# Patient Record
Sex: Female | Born: 1965 | Race: White | Hispanic: No | Marital: Married | Smoking: Former smoker
Health system: Southern US, Community
[De-identification: ages and names within clinical notes are randomized; demographics above are authoritative.]

## PROBLEM LIST (undated history)

## (undated) DIAGNOSIS — I10 Essential (primary) hypertension: Secondary | ICD-10-CM

## (undated) DIAGNOSIS — C801 Malignant (primary) neoplasm, unspecified: Secondary | ICD-10-CM

## (undated) HISTORY — DX: Essential (primary) hypertension: I10

## (undated) HISTORY — DX: Malignant (primary) neoplasm, unspecified: C80.1

## (undated) HISTORY — PX: HERNIA REPAIR: SHX51

## (undated) HISTORY — PX: LEEP: SHX91

---

## 2000-07-29 ENCOUNTER — Other Ambulatory Visit: Admission: RE | Admit: 2000-07-29 | Discharge: 2000-07-29 | Payer: Self-pay | Admitting: Physician Assistant

## 2000-11-05 ENCOUNTER — Other Ambulatory Visit: Admission: RE | Admit: 2000-11-05 | Discharge: 2000-11-05 | Payer: Self-pay | Admitting: *Deleted

## 2000-11-06 ENCOUNTER — Other Ambulatory Visit: Admission: RE | Admit: 2000-11-06 | Discharge: 2000-11-06 | Payer: Self-pay | Admitting: *Deleted

## 2000-11-06 ENCOUNTER — Encounter (INDEPENDENT_AMBULATORY_CARE_PROVIDER_SITE_OTHER): Payer: Self-pay

## 2000-11-21 ENCOUNTER — Other Ambulatory Visit: Admission: RE | Admit: 2000-11-21 | Discharge: 2000-11-21 | Payer: Self-pay | Admitting: *Deleted

## 2000-12-10 ENCOUNTER — Ambulatory Visit: Admission: RE | Admit: 2000-12-10 | Discharge: 2000-12-10 | Payer: Self-pay | Admitting: Gynecology

## 2001-05-11 ENCOUNTER — Other Ambulatory Visit: Admission: RE | Admit: 2001-05-11 | Discharge: 2001-05-11 | Payer: Self-pay | Admitting: *Deleted

## 2005-04-08 ENCOUNTER — Ambulatory Visit: Payer: Self-pay | Admitting: Internal Medicine

## 2005-04-08 ENCOUNTER — Other Ambulatory Visit: Admission: RE | Admit: 2005-04-08 | Discharge: 2005-04-08 | Payer: Self-pay | Admitting: Internal Medicine

## 2007-04-07 ENCOUNTER — Ambulatory Visit: Payer: Self-pay | Admitting: Internal Medicine

## 2007-04-07 DIAGNOSIS — L258 Unspecified contact dermatitis due to other agents: Secondary | ICD-10-CM | POA: Insufficient documentation

## 2008-03-25 ENCOUNTER — Inpatient Hospital Stay (HOSPITAL_COMMUNITY): Admission: AD | Admit: 2008-03-25 | Discharge: 2008-03-25 | Payer: Self-pay | Admitting: Obstetrics and Gynecology

## 2008-03-31 ENCOUNTER — Inpatient Hospital Stay (HOSPITAL_COMMUNITY): Admission: AD | Admit: 2008-03-31 | Discharge: 2008-04-06 | Payer: Self-pay | Admitting: Obstetrics and Gynecology

## 2008-04-02 ENCOUNTER — Encounter (INDEPENDENT_AMBULATORY_CARE_PROVIDER_SITE_OTHER): Payer: Self-pay | Admitting: Obstetrics & Gynecology

## 2008-04-07 ENCOUNTER — Encounter: Admission: RE | Admit: 2008-04-07 | Discharge: 2008-05-07 | Payer: Self-pay | Admitting: Obstetrics and Gynecology

## 2008-05-08 ENCOUNTER — Encounter: Admission: RE | Admit: 2008-05-08 | Discharge: 2008-06-07 | Payer: Self-pay | Admitting: Obstetrics and Gynecology

## 2008-06-08 ENCOUNTER — Encounter: Admission: RE | Admit: 2008-06-08 | Discharge: 2008-07-07 | Payer: Self-pay | Admitting: Obstetrics and Gynecology

## 2008-07-08 ENCOUNTER — Encounter: Admission: RE | Admit: 2008-07-08 | Discharge: 2008-08-07 | Payer: Self-pay | Admitting: Obstetrics and Gynecology

## 2008-08-08 ENCOUNTER — Encounter: Admission: RE | Admit: 2008-08-08 | Discharge: 2008-08-19 | Payer: Self-pay | Admitting: Obstetrics and Gynecology

## 2011-01-29 NOTE — Discharge Summary (Signed)
NAMEHAILI, Jennifer Monroe              ACCOUNT NO.:  [id number]   MEDICAL RECORD NO.:  [id number]          PATIENT TYPE:  INP   LOCATION:  9306                          FACILITY:  [facility]   PHYSICIAN:  Dineen Kid. Rana Snare, M.D.    DATE OF BIRTH:  June 28, 1966   DATE OF ADMISSION:  03/31/2008  DATE OF DISCHARGE:  04/06/2008                               DISCHARGE SUMMARY   ADMITTING DIAGNOSES:  1. Intrauterine pregnancy at 40 weeks' estimated gestational age.  2. Twin gestation.  3. Spontaneous rupture of membranes.   DISCHARGE DIAGNOSES:  1. Status post low transverse cesarean section.  2. Viable female and female infant.   PROCEDURE:  Primary low-transverse cesarean section with a vertical  extension of the incision.   REASON FOR ADMISSION:  Please see written H and P.   HOSPITAL COURSE:  The patient was a 45 year old gravida 1, para 0 that  presents to [facility] at 37 weeks' estimated  gestational age with twin gestation.  The patient had complained of some  vaginal bleeding.  She was also noted to have some increasing  contractions.  The patient also complained of some leakage of fluid.  Sterile speculum exam did reveal positive pool, positive Nitrazine and  easily cervix was dilated at 1 cm.  On admission, WBC count was 48,000.  The patient was afebrile.  She was now admitted for antibiotic therapy,  betamethasone and tocolysis of her labor.  The patient did have maternal  fetal medicine consult and thought was to continue with the tocolysis  until the betamethasone had had an opportunity to assist lung maturity.  Late that evening, the patient was noted to have some difficulty with  breathing.  Exam was otherwise normal, O2 saturation was 94% on room  air.  Lungs were clear to auscultation.  Because of these symptoms,  decision was made to decrease her magnesium to 1 gram per hour.  Mag  level was noted to be 5.2.  Approximately 3 hours later, decision was  notified that  the patient did complain of some pressure; however, the  nurse could not determine whether or not the cervix was dilated or not.  Physician arrived shortly thereafter and was found to be twin A within  the vertex presentation of +3 station, completely dilated.  Based on  prematurity and now on the second twin within the transverse  presentation and the larger of the 2 twins, decision was made to urgent  labor and take the patient to the operating room where spinal anesthesia  was administered without difficulty.  A low transverse incision was made  with the delivery of a viable female infant; however, there was some  difficulty with the second twin and extension vertically was made of the  incision in order to delivery the second twin which was a viable female  infant.  Babies were taken by the NICU team.  Closure was performed, 2  small myomas were also noted and were removed.  Hemostasis was obtained  and the patient was then transferred to the recovery room in stable  condition.  On postoperative  day #1, the patient was stable, she was  without complaint.  Abdomen soft.  Abdominal dressing noted to be clean,  dry and intact.  On postoperative day #2, the patient was without  complaint.  Temperature was noted to be 99.6.  Vital signs were  otherwise stable.  Abdomen soft.  Fundus firm, moderate tenderness  noted.  Incision was clean, dry and intact.  The patient was thought to  possibly have some endometritis and was started on Augmentin.  On  postoperative day #3, the patient was without complaint.  Temperature  max was 101.  Fundus firm, nontender.  Incision was clean, dry and  intact.  Babies were stable in the NICU.  The patient continued on  Augmentin.  On postoperative day #4, the patient was without complaint.  Vital signs were stable.  She was afebrile.  Babies were stable in the  NICU off the ventilator.  Fundus was firm and nontender.  T-shaped  incision was noted.  Right-sided  inguinal hernia noted.  Discharge  instructions were reviewed and the patient was later discharged home.   CONDITION ON DISCHARGE:  Stable.   DIET:  Regular as tolerated.   ACTIVITY:  No heavy lifting, no driving x2 weeks, no vaginal entry.   FOLLOWUP:  The patient is to follow up in the office in 4 days for  staple removal.  She is to call for temperature greater than 100  degrees, persistent nausea, vomiting, vaginal bleeding, and/or redness  or drainage at incisional site.   DISCHARGE MEDICATIONS:  1. Tylox #30 one p.o. every 4-6 hours.  2. Motrin 600 mg every 6 hours.  3. Augmentin 500 mg 1 p.o. t.i.d.      Julio Sicks, N.P.      Dineen Kid Rana Snare, M.D.  Electronically Signed    CC/MEDQ  D:  05/01/2008  T:  05/02/2008  Job:  [id number]

## 2011-01-29 NOTE — Op Note (Signed)
NAMEEVERLINE, MAHAFFY NO.:  [id number]   MEDICAL RECORD NO.:  [id number]          PATIENT TYPE:  INP   LOCATION:  9306                          FACILITY:  [facility]   PHYSICIAN:  Freddy Finner, M.D.   DATE OF BIRTH:  Sep 28, 1965   DATE OF PROCEDURE:  04/02/2008  DATE OF DISCHARGE:                               OPERATIVE REPORT   PREOPERATIVE DIAGNOSES:  1. Intrauterine pregnancy at [redacted] weeks gestation with twins.  2. Premature rupture of membranes.  3. Preterm labor.  4. Sudden progression of labor with known transverse Twin B and vertex      Twin A.   POSTOPERATIVE DIAGNOSES:  1. Intrauterine pregnancy at [redacted] weeks gestation with twins.  2. Premature rupture of membranes.  3. Preterm labor.  4. Sudden progression of labor with known transverse Twin B and vertex      Twin A.   OPERATIVE PROCEDURE:  Primary cesarean section with transverse lower  uterine segment incision followed by vertical extension of incision as  well as extension of skin incision.   SURGEON:  Freddy Finner, M.D.   ANESTHESIA:  Spinal.   ESTIMATED INTRAOPERATIVE BLOOD LOSS:  800 mL.   Apgars are not know to me at the time of this dictation, but the infants  were viable Twin A female and viable Twin B female. NICU teams were in  attendance.   The patient is a 45 year old was admitted on July 16 with spontaneous  rupture of membranes of Twin A.  The patient was admitted to the  hospital, tocolysis had been given, IV antibiotics.  She had maternal  fetal medicine consult.  Plan was to continue tocolysis at least until  steroids had an opportunity to fully act.  Late in the evening of the  17th, I was called send the patient for increasing difficulty with  breathing.  Her exam seemed to be normal with O2 saturations of 94 on  room air, and lungs were clear to auscultation throughout, but because  of the symptoms, it was elected to get a magnesium level which was 5.2,  and we decreased her  magnesium from 2 g an hour to 1 g an hour.  Approximately 3 hours later, I got a call from the nurse saying the  patient was feeling a strong urge to have a bowel movement, and on her  examination, she could not comfortably determined whether or not the  cervix was dilated.  She could not find the cervical os.  On my exam  shortly thereafter, it was apparent that the patient was completely  dilated.  The Twin A vertex was +3.  Based on the prematurely, based on  the known position on the second twin, it was elected to proceed with  cesarean delivery.  The patient was rapidly brought to the operating  room.  She had a Foley catheter in place.  She was placed under spinal  anesthesia, placed in dorsal recumbent position with elevation of the  right hip by 15 degrees.  The abdomen was prepped and draped in the  usual fashion.  A  low abdominal transverse incision was made and carried  sharply down to fascia.  Fascia was entered sharply and extended to the  external skin incision.  Rectus muscles were divided in midline.  Peritoneum was elevated and entered sharply.  Bladder blade was placed.  Transverse incision was made in the visceral peritoneum and bladder  bluntly resected off the lower segment.  Transverse incision was made in  the lower uterine segment and extended in a transverse direction with  bandage scissors.  Repeated attempts to elevate the Twin A head out of  the pelvis were unsuccessful, even with the vaginal end from the nurse  to try to elevate the head out of the pelvis.  The patient was given  sublingual nitroglycerin.  Again, repeated attempts were unsuccessful,  and it was elected to extend the incision and perhaps deliver the Twin B  first.  The skin incision was vertically extended for a distance of  approximately 8-10 cm as was the uterine incision.  By this time,  apparently the nitroglycerin had had more of an effect, and Twin A  essentially floated up out of the  pelvis and was easily delivered.  Twin  B was then delivered after amniotomy with a breech extraction.  The  infant was transverse back up.  This was a viable female.  Placenta and  other products of conception were removed from the uterus.  Uterus was  delivered onto the anterior abdominal wall.  Edges of the uterine  incision were grasped with ring forceps.  The transverse incision was  closed using looped 0 Dexon from the right margin to the midline.  The  midline vertical incision was then closed with a running locking looped  0 Dexon.  The transverse incision was then completely closed with a  running double Dexon.  Hemostasis was noted to be adequate.  Tubes and  ovaries were found to be normal.  Two small myomas which were actually  with the incision were excised for more easy closure of the transverse  and uterine incision.  These measured 1 approximately 2 cm x 1 cm, the  other approximately 1 cm.  These were submitted separately for  histologic exam.  With adequate hemostasis, uterus was placed back into  the abdominal cavity.  Irrigation was carried out.  Fascial incisions  were then closed starting with vertically using double looped PDS to  rule the fascia from superior to inferior and running the fascia  transversely from angle to angle on either side.  The subcutaneous  tissue at the vertical portion of the incision was closed with deep  suture 2-0 plane.  Skin incision was closed with skin staples.  The  patient tolerated the operative procedure well.  She was taken to the  recovery room in good condition.      Freddy Finner, M.D.  Electronically Signed     WRN/MEDQ  D:  04/02/2008  T:  04/02/2008  Job:  [id number]

## 2011-02-01 NOTE — Consult Note (Signed)
[facility]  Patient:    Jennifer Monroe, Jennifer Monroe                       MRN: [id number] Proc. Date: 12/10/00 Adm. Date:  [id number] Attending:  Jeannette Corpus CC:         Sung Amabile. Jennifer Monroe, M.D.  Velora Heckler, M.D.  Telford Nab, R.N.   Consultation Report  HISTORY OF PRESENT ILLNESS:  [age]-year-old white female referred by Dr. Sung Amabile. Jennifer Monroe for evaluation of a microinvasive carcinoma of the cervix. The patient underwent a LEEP procedure for evaluation of severe dysplasia on March 7th.  Final pathology showed the patient had an extensive carcinoma in situ with positive endocervical margin; in addition, she had one area of focal microinvasion up to 0.5 mm.  The patient has had an uncomplicated postoperative course.  PAST MEDICAL HISTORY:  Medical illnesses:  None.  SURGICAL HISTORY:  None.  CURRENT MEDICATIONS:  Ortho Tri-Cyclen.  SOCIAL HISTORY:  The patient smokes about one-half pack per day.  REVIEW OF SYSTEMS:  Otherwise negative.  The patient is strongly desirous of preserving fertility if at all possible.  PHYSICAL EXAMINATION:  VITAL SIGNS:  Weight 132 pounds.  Height 5 feet.  Blood pressure 118/80. Pulse 72.  Respiratory rate 16.  GENERAL:  The patient is a healthy white female in no acute distress.  HEENT:  Negative.  NECK:  Supple without thyromegaly.  NODES:  There is no supraclavicular or inguinal adenopathy.  ABDOMEN:  Soft and nontender.  No mass, organomegaly, ascites or herniae are noted.  PELVIC:  EG/BUS normal.  Vagina is clean.  The cervix is healing nicely following a LEEP procedure.  Bimanual reveals no masses, nodularity, induration or parametrial excision.  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  She does have positive endocervical margins.  PLAN:  I had a lengthy discussion with the patient and her  parents regarding management options.  In essence, I feel comfortable with conservative management of this patient, given the minimal amount of invasion.  The only question that remains is the issue regarding her positive endocervical margin. While one might consider repeat conization, there is good evidence that the majority of patients with positive margins will actually not have any residual disease on followup conization.  Therefore, I would recommend the patient be followed closely but that no conization be done unless she has persistent disease.  I would recommend she have her first Pap smear and endocervical curettage in four months and repeat both the Pap and endocervical curettage again in four months.  If both of those visits result in normal studies, I would recommend she have Pap smears every four to six months indefinitely.  The patient will return to the care of Dr. Roslyn Monroe.  All of their questions are answered. DD:  12/10/00 TD:  12/11/00 Job: [id number]

## 2011-06-14 LAB — DIFFERENTIAL
Eosinophils Absolute: 0
Eosinophils Absolute: 0
Eosinophils Relative: 0
Lymphs Abs: 1
Lymphs Abs: 1.1
Monocytes Absolute: 0.6
Monocytes Relative: 3
Monocytes Relative: 4
Neutro Abs: 20.4 — ABNORMAL HIGH
Neutrophils Relative %: 93 — ABNORMAL HIGH

## 2011-06-14 LAB — CBC
HCT: 24.3 — ABNORMAL LOW
HCT: 29 — ABNORMAL LOW
HCT: 35.6 — ABNORMAL LOW
Hemoglobin: 11.1 — ABNORMAL LOW
Hemoglobin: 11.6 — ABNORMAL LOW
MCHC: 32.6
MCHC: 34.4
MCHC: 34.5
MCV: 98.9
MCV: 99.5
MCV: 99.6
Platelets: 215
RBC: 2.92 — ABNORMAL LOW
RBC: 3.32 — ABNORMAL LOW
RBC: 3.65 — ABNORMAL LOW
RDW: 13.9
WBC: 18.9 — ABNORMAL HIGH
WBC: 22 — ABNORMAL HIGH

## 2011-06-14 LAB — TYPE AND SCREEN: Antibody Screen: NEGATIVE

## 2012-09-01 ENCOUNTER — Ambulatory Visit (INDEPENDENT_AMBULATORY_CARE_PROVIDER_SITE_OTHER): Payer: PRIVATE HEALTH INSURANCE | Admitting: Family Medicine

## 2012-09-01 VITALS — BP 122/82 | HR 92 | Temp 98.5°F | Resp 18 | Ht 59.5 in | Wt 222.8 lb

## 2012-09-01 DIAGNOSIS — L0231 Cutaneous abscess of buttock: Secondary | ICD-10-CM

## 2012-09-01 DIAGNOSIS — K6289 Other specified diseases of anus and rectum: Secondary | ICD-10-CM

## 2012-09-01 MED ORDER — SULFAMETHOXAZOLE-TRIMETHOPRIM 800-160 MG PO TABS: 2.0000 | ORAL_TABLET | Freq: Two times a day (BID) | ORAL | Status: DC

## 2012-09-01 NOTE — Progress Notes (Signed)
Subjective:    Patient ID: Jennifer Monroe, female    DOB: 1966/09/09, 46 y.o.   MRN: [id number] Chief Complaint  Patient presents with  . Rectal Pain    a cyst on bottom, she has had blood and mucus in stool    HPI  46 yo woman has a lump on buttock near rectum and gluteal cleft- first noticed about 3 wks.  Feels like it is getting bigger, painful with sitting on it and with palpation. Not draining - hoping it is a cyst but she has had other abscess other places prior which required I&D.    Pt has had blood in her stool and bloody rectal discharge into her underwear for the past wk.  No pain with BM.  Current blood in stool looks similar to prev hemorrhoidal flair but she does not have any of the other symptoms such as rectal itching or pain.  Has not tried any hemorrhoid cream.  Stools very thin x6 mos. No constipation, hard stools, or straining. Has had a h/o cervical cancer treated in 2004.  Saw Dr. Chaney Malling at [facility] who did leep and told her they get it all and then had twins 4 yrs ago by c/s  Then developed hemorrhoids and increased weight since 2009.   Past Medical History  Diagnosis Date  . Cancer - cervical, cancer free s/p LEEP 2004   Review of Systems  Constitutional: Positive for activity change. Negative for fever, chills and diaphoresis.  Gastrointestinal: Positive for diarrhea, blood in stool and anal bleeding. Negative for nausea, vomiting, abdominal pain, constipation, abdominal distention and rectal pain.  Genitourinary: Negative for hematuria, vaginal bleeding, genital sores and pelvic pain.  Musculoskeletal: Positive for myalgias. Negative for joint swelling and gait problem.  Skin: Positive for rash. Negative for color change, pallor and wound.  Hematological: Negative for adenopathy. Does not bruise/bleed easily.  Psychiatric/Behavioral: Negative for sleep disturbance. The patient is not nervous/anxious.         BP 122/82  Pulse 92  Temp 98.5 F (36.9 C) (Oral)   Resp 18  Ht 4' 11.5" (1.511 m)  Wt 222 lb 12.8 oz (101.061 kg)  BMI 44.25 kg/m2  SpO2 99%  LMP 08/24/2012 Objective:   Physical Exam  Constitutional: She is oriented to person, place, and time. She appears well-developed and well-nourished. No distress.  HENT:  Head: Normocephalic and atraumatic.  Eyes: Conjunctivae normal are normal. No scleral icterus.  Pulmonary/Chest: Effort normal.  Genitourinary: Rectal exam shows internal hemorrhoid, mass and tenderness. Rectal exam shows no fissure and anal tone normal. Guaiac negative stool.       Large internal hemorrhoid protruding from rectum - soft, non-painful, non-inflamed, thickening and tenderness of the rectal vault around 12 and 3 o'clock  Musculoskeletal: She exhibits no edema.  Neurological: She is alert and oriented to person, place, and time.  Skin: Skin is warm and dry. Lesion noted. She is not diaphoretic. There is erythema.       On Left buttock sev inches above rectum is mildly erythemic and tender fluctuant mass approx 1cm x 3 cm with length  extending from gluteal cleft laterally  Psychiatric: She has a normal mood and affect. Her behavior is normal.          Results for orders placed in visit on 09/01/12  IFOBT (OCCULT BLOOD)      Component Value Range   IFOBT Negative      Assessment & Plan:   1. Rectal pain  Ambulatory  referral to Gastroenterology, IFOBT POC (occult bld, rslt in office)  2. Abscess of buttock  Wound culture, sulfamethoxazole-trimethoprim (BACTRIM DS,SEPTRA DS) 800-160 MG per tablet  I am concerned about thickening palpable on rectal exam and her 6 mo h/o new onset pencil-thin stools.  Has internal hemorrhoids but not flaired currently.  I will ask that she will be evaluated by GI to ensure no rectal carcinoma and consider more permanent treatment for her hemorrhoids.  F/u in 2 d for packing change.

## 2012-09-01 NOTE — Progress Notes (Signed)
Procedure: Consent obtained.  Local anesthesia with 2% lido with epi.  Area cleaned and #11 blade used to make 1 in incision.  Purulent discharge expressed.  1/4in packing placed. Drsg placed.

## 2012-09-01 NOTE — Patient Instructions (Signed)
Warm compresses.  Take antibiotics. Recheck on Thursday.

## 2012-09-03 ENCOUNTER — Ambulatory Visit (INDEPENDENT_AMBULATORY_CARE_PROVIDER_SITE_OTHER): Payer: PRIVATE HEALTH INSURANCE | Admitting: Physician Assistant

## 2012-09-03 VITALS — BP 107/74 | HR 83 | Temp 97.9°F | Resp 16 | Ht 60.0 in | Wt 223.8 lb

## 2012-09-03 DIAGNOSIS — Z23 Encounter for immunization: Secondary | ICD-10-CM

## 2012-09-03 DIAGNOSIS — L0231 Cutaneous abscess of buttock: Secondary | ICD-10-CM

## 2012-09-03 DIAGNOSIS — L03317 Cellulitis of buttock: Secondary | ICD-10-CM

## 2012-09-03 NOTE — Progress Notes (Signed)
   [address] [place] [address]   Phone [phone]  Subjective:    Patient ID: Jennifer Monroe, female    DOB: 02-06-1966, 46 y.o.   MRN: [id number]  HPI Pt presents to clinic for wound recheck.  A lot of drainage has come out of the area.  She is experiencing slightly improved pain but the area is still very sore.  She has been tolerating her antibiotic ok.   She would like to get her Flu vaccine today.   Review of Systems  Constitutional: Negative for fever and chills.  Gastrointestinal: Negative for nausea and diarrhea.       Objective:   Physical Exam  Vitals reviewed. Constitutional: She is oriented to person, place, and time. She appears well-developed and well-nourished.  HENT:  Head: Normocephalic and atraumatic.  Right Ear: External ear normal.  Left Ear: External ear normal.  Pulmonary/Chest: Effort normal.  Neurological: She is alert and oriented to person, place, and time.  Skin: Skin is warm and dry.       Packing removed.  Small amount of purulent discharge expressed.  Wound cavity irrigated with 2% lido.  Wound cavity repacked with 1/4in plain packing.  Drsg placed. There is no erythema around the wound.  Less induration.  Psychiatric: She has a normal mood and affect. Her behavior is normal. Judgment and thought content normal.       Assessment & Plan:   1. Cellulitis of buttock, right  Flu vaccine greater than or equal to 3yo preservative free IM  2. Flu vaccine need  Flu vaccine greater than or equal to 3yo preservative free IM   To f/u in 2 days.  At that time will reassess the area but expect pt to need several more visits for packing.  Pt to continue with her antibiotics and warm compresses.

## 2012-09-07 ENCOUNTER — Ambulatory Visit (INDEPENDENT_AMBULATORY_CARE_PROVIDER_SITE_OTHER): Payer: PRIVATE HEALTH INSURANCE | Admitting: Physician Assistant

## 2012-09-07 VITALS — BP 108/74 | HR 85 | Temp 98.0°F | Resp 16 | Ht 60.0 in | Wt 223.0 lb

## 2012-09-07 DIAGNOSIS — L0231 Cutaneous abscess of buttock: Secondary | ICD-10-CM

## 2012-09-07 DIAGNOSIS — L03317 Cellulitis of buttock: Secondary | ICD-10-CM

## 2012-09-07 NOTE — Progress Notes (Signed)
   [address], [place] [address]   Phone [phone]  Subjective:    Patient ID: Jennifer Monroe, female    DOB: 31-Jul-1966, 46 y.o.   MRN: [id number]  HPI  Pt presents to clinic for wound recheck.  She is unsure if the packing is still in the wound.  The pain is much better.  She was not able to come in Sat to be seen, but feels like she is getting better.  Small amount of drainage from the area.  Tolerating abx ok still.  Review of Systems  Constitutional: Negative for fever and chills.  Gastrointestinal: Negative for nausea.  Skin: Positive for wound.       Objective:   Physical Exam  Vitals reviewed. Constitutional: She is oriented to person, place, and time. She appears well-developed and well-nourished.  HENT:  Head: Normocephalic and atraumatic.  Right Ear: External ear normal.  Left Ear: External ear normal.  Eyes: Conjunctivae normal are normal.  Pulmonary/Chest: Effort normal.  Neurological: She is alert and oriented to person, place, and time.  Skin: Skin is warm and dry.       Wound on R buttocks - without surrounding erythema or induration.  Packing is not present.  Good granulation tissue at the base of wound cavity. Repacked with 1/4in plain packing.  Drsg placed. Pt tolerated well.  Psychiatric: She has a normal mood and affect. Her behavior is normal. Judgment and thought content normal.       Assessment & Plan:   1. Cellulitis of buttock, right    Continue antibiotic until finished.  Keep covered until healed.  F/u only if needed.

## 2013-05-11 ENCOUNTER — Other Ambulatory Visit: Payer: Self-pay | Admitting: Family Medicine

## 2013-07-22 ENCOUNTER — Other Ambulatory Visit: Payer: Self-pay

## 2017-06-05 ENCOUNTER — Encounter: Payer: Self-pay | Admitting: Internal Medicine

## 2018-04-22 DIAGNOSIS — H109 Unspecified conjunctivitis: Secondary | ICD-10-CM | POA: Diagnosis not present

## 2018-04-22 DIAGNOSIS — R030 Elevated blood-pressure reading, without diagnosis of hypertension: Secondary | ICD-10-CM | POA: Diagnosis not present

## 2018-04-22 DIAGNOSIS — R0981 Nasal congestion: Secondary | ICD-10-CM | POA: Diagnosis not present

## 2018-04-27 DIAGNOSIS — H1013 Acute atopic conjunctivitis, bilateral: Secondary | ICD-10-CM | POA: Diagnosis not present

## 2018-05-28 ENCOUNTER — Ambulatory Visit: Payer: BLUE CROSS/BLUE SHIELD | Admitting: Family Medicine

## 2018-05-28 ENCOUNTER — Other Ambulatory Visit: Payer: Self-pay

## 2018-05-28 ENCOUNTER — Encounter: Payer: Self-pay | Admitting: Family Medicine

## 2018-05-28 VITALS — BP 138/76 | HR 98 | Temp 97.6°F | Ht 60.0 in | Wt 251.0 lb

## 2018-05-28 DIAGNOSIS — M19041 Primary osteoarthritis, right hand: Secondary | ICD-10-CM | POA: Insufficient documentation

## 2018-05-28 DIAGNOSIS — Z1211 Encounter for screening for malignant neoplasm of colon: Secondary | ICD-10-CM

## 2018-05-28 DIAGNOSIS — Z23 Encounter for immunization: Secondary | ICD-10-CM | POA: Diagnosis not present

## 2018-05-28 DIAGNOSIS — Z6841 Body Mass Index (BMI) 40.0 and over, adult: Secondary | ICD-10-CM | POA: Diagnosis not present

## 2018-05-28 DIAGNOSIS — M222X1 Patellofemoral disorders, right knee: Secondary | ICD-10-CM

## 2018-05-28 DIAGNOSIS — M19042 Primary osteoarthritis, left hand: Secondary | ICD-10-CM

## 2018-05-28 DIAGNOSIS — Z1239 Encounter for other screening for malignant neoplasm of breast: Secondary | ICD-10-CM

## 2018-05-28 DIAGNOSIS — I10 Essential (primary) hypertension: Secondary | ICD-10-CM | POA: Insufficient documentation

## 2018-05-28 DIAGNOSIS — M222X2 Patellofemoral disorders, left knee: Secondary | ICD-10-CM

## 2018-05-28 LAB — LIPID PANEL
CHOLESTEROL: 145 mg/dL (ref 0–200)
HDL: 35.1 mg/dL — ABNORMAL LOW (ref >39.00)
LDL Cholesterol: 83 mg/dL (ref 0–99)
NonHDL: 109.9
Total CHOL/HDL Ratio: 4
Triglycerides: 134 mg/dL (ref 0.0–149.0)
VLDL: 26.8 mg/dL (ref 0.0–40.0)

## 2018-05-28 LAB — COMPREHENSIVE METABOLIC PANEL
ALBUMIN: 4.3 g/dL (ref 3.5–5.2)
ALT: 14 U/L (ref 0–35)
AST: 14 U/L (ref 0–37)
Alkaline Phosphatase: 75 U/L (ref 39–117)
BUN: 13 mg/dL (ref 6–23)
CO2: 30 mEq/L (ref 19–32)
CREATININE: 0.74 mg/dL (ref 0.40–1.20)
Calcium: 9.6 mg/dL (ref 8.4–10.5)
Chloride: 101 mEq/L (ref 96–112)
GFR: 87.73 mL/min (ref >60.00)
Glucose, Bld: 105 mg/dL — ABNORMAL HIGH (ref 70–99)
POTASSIUM: 4.8 meq/L (ref 3.5–5.1)
SODIUM: 139 meq/L (ref 135–145)
Total Bilirubin: 0.5 mg/dL (ref 0.2–1.2)
Total Protein: 7.9 g/dL (ref 6.0–8.3)

## 2018-05-28 LAB — TSH: TSH: 1.23 u[IU]/mL (ref 0.35–4.50)

## 2018-05-28 LAB — CBC
HCT: 40 % (ref 36.0–46.0)
HEMOGLOBIN: 13.3 g/dL (ref 12.0–15.0)
MCHC: 33.3 g/dL (ref 30.0–36.0)
MCV: 90.2 fl (ref 78.0–100.0)
PLATELETS: 316 10*3/uL (ref 150.0–400.0)
RBC: 4.43 Mil/uL (ref 3.87–5.11)
RDW: 13.5 % (ref 11.5–15.5)
WBC: 7.4 10*3/uL (ref 4.0–10.5)

## 2018-05-28 MED ORDER — DICLOFENAC SODIUM 1 % TD GEL: 4.0000 g | Freq: Four times a day (QID) | TRANSDERMAL | 3 refills | Status: DC | PRN

## 2018-05-28 MED ORDER — LOSARTAN POTASSIUM 50 MG PO TABS: 25.0000 mg | ORAL_TABLET | Freq: Every day | ORAL | 3 refills | Status: DC

## 2018-05-28 NOTE — Assessment & Plan Note (Signed)
BP is fairly well controlled She prefer losartan over lisinopril, reasonable to switch Encouraged to continue with weight loss Follow low salt diet F/u in 2 months for CPE/BP

## 2018-05-28 NOTE — Assessment & Plan Note (Signed)
Trial of topical diclofenac HEP provided

## 2018-05-28 NOTE — Assessment & Plan Note (Addendum)
Trial of topical diclofenac initially.

## 2018-05-28 NOTE — Progress Notes (Signed)
Deliyah Muckle - 52 y.o. female MRN [id number]  Date of birth: [birth year]  Subjective Chief Complaint  Patient presents with  . Establish Care    She was seen at Urgent care 1 month ago for elevated hypertension-placed on lisinopril    HPI Azrael Huss is a 52 y.o. female with history of recently diagnosed HTN here today to establish care.  She also has concern about some joint pain/swelling.  She has not had a regular PCP in several years and has several health maintenance items that need updating.  She would like to receive flu vaccine today.  She is not up to date on breast cancer screening, colon cancer screening or cervical cancer screening .  -HTN:  Seen at urgent care about 1 month ago due to some blurred vision and found to have elevated BP.  Started on lisinopril and had done well with this for the most part, however ran out recently and started taking 1/2 of her husbands losartan 50mg .  She tells me that she feels better taking this and feels less tired.  She denies side effects from losartan.  She has been working on lifestyle changes and doing intermittent fasting. She has lost 10 lbs so far doing this.  She is tolerating this diet well.   -Joint pain:  Intermittent pain in knee joints as well as some swelling in fingers.  She has noticed that L knee cap will sometimes slide out of place.  No prior injury.  Denies significant stiffness to joints.  Has not tried anything for treatment.   ROS:  A comprehensive ROS was completed and negative except as noted per HPI  No Known Allergies  Past Medical History:  Diagnosis Date  . Cancer ([place])    Cervical  . Hypertension     Past Surgical History:  Procedure Laterality Date  . CESAREAN SECTION    . HERNIA REPAIR    . LEEP     52 years old-treated cervical cancer    Social History   Socioeconomic History  . Marital status: Married    Spouse name: Not on file  . Number of children: Not on file  . Years of education:  Not on file  . Highest education level: Not on file  Occupational History  . Not on file  Social Needs  . Financial resource strain: Not on file  . Food insecurity:    Worry: Not on file    Inability: Not on file  . Transportation needs:    Medical: Not on file    Non-medical: Not on file  Tobacco Use  . Smoking status: Former Smoker    Types: Cigarettes  . Smokeless tobacco: Never Used  Substance and Sexual Activity  . Alcohol use: No  . Drug use: No  . Sexual activity: Yes  Lifestyle  . Physical activity:    Days per week: Not on file    Minutes per session: Not on file  . Stress: Not on file  Relationships  . Social connections:    Talks on phone: Not on file    Gets together: Not on file    Attends religious service: Not on file    Active member of club or organization: Not on file    Attends meetings of clubs or organizations: Not on file    Relationship status: Not on file  Other Topics Concern  . Not on file  Social History Narrative  . Not on file    Family History  Problem Relation Age of Onset  . Cancer Mother   . Hypertension Mother   . Stroke Mother   . Kidney disease Father   . Diabetes Father   . Stroke Father   . Asthma Sister     Health Maintenance  Topic Date Due  . HIV Screening  10/24/1981  . TETANUS/TDAP  10/24/1985  . PAP SMEAR  10/25/1987  . MAMMOGRAM  10/24/2016  . COLONOSCOPY  10/24/2016  . INFLUENZA VACCINE  04/16/2018    ----------------------------------------------------------------------------------------------------------------------------------------------------------------------------------------------------------------- Physical Exam BP 138/76   Pulse 98   Temp 97.6 F (36.4 C) (Oral)   Ht 5' (1.524 m)   Wt 251 lb (113.9 kg)   SpO2 97%   BMI 49.02 kg/m   Physical Exam  Constitutional: She is oriented to person, place, and time. She appears well-nourished. No distress.  HENT:  Head: Normocephalic and  atraumatic.  Mouth/Throat: Oropharynx is clear and moist.  Eyes: No scleral icterus.  Neck: Neck supple. No thyromegaly present.  Cardiovascular: Normal rate, regular rhythm, normal heart sounds and intact distal pulses.  Pulmonary/Chest: Effort normal and breath sounds normal.  Musculoskeletal: She exhibits no edema.  heberden nodes bilaterally.   B/L knee normal to inspection and palpation.  Mild crepitus with flexion in left knee without pain.  Negative patellar compression.  Negative mcmurray.  Normal ligamentous laxity.   Lymphadenopathy:    She has no cervical adenopathy.  Neurological: She is alert and oriented to person, place, and time. No cranial nerve deficit.  Skin: Skin is warm and dry.  Psychiatric: She has a normal mood and affect. Her behavior is normal.    ------------------------------------------------------------------------------------------------------------------------------------------------------------------------------------------------------------------- Assessment and Plan  Patellofemoral arthralgia of both knees Trial of topical diclofenac HEP provided  Primary osteoarthritis of both hands Trial of topical diclofenac initially.   Essential hypertension BP is fairly well controlled She prefer losartan over lisinopril, reasonable to switch Encouraged to continue with weight loss Follow low salt diet F/u in 2 months for CPE/BP

## 2018-05-28 NOTE — Patient Instructions (Addendum)
It was great to meet you today! Congratulations on your weight loss, keep up the good work! Follow a low salt diet Continue losartan to replace the lisinopril  You will receive cologuard kit in the mail You will receive a phone call to schedule mammogram.      DASH Eating Plan DASH stands for "Dietary Approaches to Stop Hypertension." The DASH eating plan is a healthy eating plan that has been shown to reduce high blood pressure (hypertension). It may also reduce your risk for type 2 diabetes, heart disease, and stroke. The DASH eating plan may also help with weight loss. What are tips for following this plan? General guidelines  Avoid eating more than 2,300 mg (milligrams) of salt (sodium) a day. If you have hypertension, you may need to reduce your sodium intake to 1,500 mg a day.  Limit alcohol intake to no more than 1 drink a day for nonpregnant women and 2 drinks a day for men. One drink equals 12 oz of beer, 5 oz of wine, or 1 oz of hard liquor.  Work with your health care provider to maintain a healthy body weight or to lose weight. Ask what an ideal weight is for you.  Get at least 30 minutes of exercise that causes your heart to beat faster (aerobic exercise) most days of the week. Activities may include walking, swimming, or biking.  Work with your health care provider or diet and nutrition specialist (dietitian) to adjust your eating plan to your individual calorie needs. Reading food labels  Check food labels for the amount of sodium per serving. Choose foods with less than 5 percent of the Daily Value of sodium. Generally, foods with less than 300 mg of sodium per serving fit into this eating plan.  To find whole grains, look for the word "whole" as the first word in the ingredient list. Shopping  Buy products labeled as "low-sodium" or "no salt added."  Buy fresh foods. Avoid canned foods and premade or frozen meals. Cooking  Avoid adding salt when cooking. Use  salt-free seasonings or herbs instead of table salt or sea salt. Check with your health care provider or pharmacist before using salt substitutes.  Do not fry foods. Cook foods using healthy methods such as baking, boiling, grilling, and broiling instead.  Cook with heart-healthy oils, such as olive, canola, soybean, or sunflower oil. Meal planning   Eat a balanced diet that includes: ? 5 or more servings of fruits and vegetables each day. At each meal, try to fill half of your plate with fruits and vegetables. ? Up to 6-8 servings of whole grains each day. ? Less than 6 oz of lean meat, poultry, or fish each day. A 3-oz serving of meat is about the same size as a deck of cards. One egg equals 1 oz. ? 2 servings of low-fat dairy each day. ? A serving of nuts, seeds, or beans 5 times each week. ? Heart-healthy fats. Healthy fats called Omega-3 fatty acids are found in foods such as flaxseeds and coldwater fish, like sardines, salmon, and mackerel.  Limit how much you eat of the following: ? Canned or prepackaged foods. ? Food that is high in trans fat, such as fried foods. ? Food that is high in saturated fat, such as fatty meat. ? Sweets, desserts, sugary drinks, and other foods with added sugar. ? Full-fat dairy products.  Do not salt foods before eating.  Try to eat at least 2 vegetarian meals each week.  Eat more home-cooked food and less restaurant, buffet, and fast food.  When eating at a restaurant, ask that your food be prepared with less salt or no salt, if possible. What foods are recommended? The items listed may not be a complete list. Talk with your dietitian about what dietary choices are best for you. Grains Whole-grain or whole-wheat bread. Whole-grain or whole-wheat pasta. Brown rice. [place]. Bulgur. Whole-grain and low-sodium cereals. Pita bread. Low-fat, low-sodium crackers. Whole-wheat flour tortillas. Vegetables Fresh or frozen vegetables (raw, steamed,  roasted, or grilled). Low-sodium or reduced-sodium tomato and vegetable juice. Low-sodium or reduced-sodium tomato sauce and tomato paste. Low-sodium or reduced-sodium canned vegetables. Fruits All fresh, dried, or frozen fruit. Canned fruit in natural juice (without added sugar). Meat and other protein foods Skinless chicken or [place]. Ground chicken or [place]. Pork with fat trimmed off. Fish and seafood. Egg whites. Dried beans, peas, or lentils. Unsalted nuts, nut butters, and seeds. Unsalted canned beans. Lean cuts of beef with fat trimmed off. Low-sodium, lean deli meat. Dairy Low-fat (1%) or fat-free (skim) milk. Fat-free, low-fat, or reduced-fat cheeses. Nonfat, low-sodium ricotta or cottage cheese. Low-fat or nonfat yogurt. Low-fat, low-sodium cheese. Fats and oils Soft margarine without trans fats. Vegetable oil. Low-fat, reduced-fat, or light mayonnaise and salad dressings (reduced-sodium). Canola, safflower, olive, soybean, and sunflower oils. Avocado. Seasoning and other foods Herbs. Spices. Seasoning mixes without salt. Unsalted popcorn and pretzels. Fat-free sweets. What foods are not recommended? The items listed may not be a complete list. Talk with your dietitian about what dietary choices are best for you. Grains Baked goods made with fat, such as croissants, muffins, or some breads. Dry pasta or rice meal packs. Vegetables Creamed or fried vegetables. Vegetables in a cheese sauce. Regular canned vegetables (not low-sodium or reduced-sodium). Regular canned tomato sauce and paste (not low-sodium or reduced-sodium). Regular tomato and vegetable juice (not low-sodium or reduced-sodium). Angie Fava. Olives. Fruits Canned fruit in a light or heavy syrup. Fried fruit. Fruit in cream or butter sauce. Meat and other protein foods Fatty cuts of meat. Ribs. Fried meat. Berniece Salines. Sausage. Bologna and other processed lunch meats. Salami. Fatback. Hotdogs. Bratwurst. Salted nuts and seeds. Canned  beans with added salt. Canned or smoked fish. Whole eggs or egg yolks. Chicken or [place] with skin. Dairy Whole or 2% milk, cream, and half-and-half. Whole or full-fat cream cheese. Whole-fat or sweetened yogurt. Full-fat cheese. Nondairy creamers. Whipped toppings. Processed cheese and cheese spreads. Fats and oils Butter. Stick margarine. Lard. Shortening. Ghee. Bacon fat. Tropical oils, such as coconut, palm kernel, or palm oil. Seasoning and other foods Salted popcorn and pretzels. Onion salt, garlic salt, seasoned salt, table salt, and sea salt. Worcestershire sauce. Tartar sauce. Barbecue sauce. Teriyaki sauce. Soy sauce, including reduced-sodium. Steak sauce. Canned and packaged gravies. Fish sauce. Oyster sauce. Cocktail sauce. Horseradish that you find on the shelf. Ketchup. Mustard. Meat flavorings and tenderizers. Bouillon cubes. Hot sauce and Tabasco sauce. Premade or packaged marinades. Premade or packaged taco seasonings. Relishes. Regular salad dressings. Where to find more information:  National Heart, Lung, and [place]: https://wilson-eaton.com/  American Heart Association: www.heart.org Summary  The DASH eating plan is a healthy eating plan that has been shown to reduce high blood pressure (hypertension). It may also reduce your risk for type 2 diabetes, heart disease, and stroke.  With the DASH eating plan, you should limit salt (sodium) intake to 2,300 mg a day. If you have hypertension, you may need to reduce your sodium intake  to 1,500 mg a day.  When on the DASH eating plan, aim to eat more fresh fruits and vegetables, whole grains, lean proteins, low-fat dairy, and heart-healthy fats.  Work with your health care provider or diet and nutrition specialist (dietitian) to adjust your eating plan to your individual calorie needs. This information is not intended to replace advice given to you by your health care provider. Make sure you discuss any questions you have with your  health care provider. Document Released: 08/22/2011 Document Revised: 08/26/2016 Document Reviewed: 08/26/2016 Elsevier Interactive Patient Education  [employer].

## 2018-06-04 ENCOUNTER — Other Ambulatory Visit: Payer: Self-pay | Admitting: Family Medicine

## 2018-06-04 DIAGNOSIS — Z1231 Encounter for screening mammogram for malignant neoplasm of breast: Secondary | ICD-10-CM

## 2018-07-09 ENCOUNTER — Ambulatory Visit
Admission: RE | Admit: 2018-07-09 | Discharge: 2018-07-09 | Disposition: A | Discharge status: Home / Self Care | Payer: BLUE CROSS/BLUE SHIELD | Source: Ambulatory Visit | Attending: Family Medicine | Admitting: Family Medicine

## 2018-07-09 DIAGNOSIS — Z1231 Encounter for screening mammogram for malignant neoplasm of breast: Secondary | ICD-10-CM

## 2018-07-28 ENCOUNTER — Ambulatory Visit (INDEPENDENT_AMBULATORY_CARE_PROVIDER_SITE_OTHER): Payer: BLUE CROSS/BLUE SHIELD | Admitting: Family Medicine

## 2018-07-28 ENCOUNTER — Encounter: Payer: Self-pay | Admitting: Family Medicine

## 2018-07-28 VITALS — BP 150/98 | HR 89 | Temp 97.4°F | Ht 60.0 in | Wt 250.6 lb

## 2018-07-28 DIAGNOSIS — I10 Essential (primary) hypertension: Secondary | ICD-10-CM | POA: Diagnosis not present

## 2018-07-28 NOTE — Patient Instructions (Signed)

## 2018-07-28 NOTE — Progress Notes (Signed)
Jennifer Monroe - 52 y.o. female MRN [id number]  Date of birth: 04-12-1966  Subjective Chief Complaint  Patient presents with  . Follow-up    pt f/u on HTN , pt sts she has been feeling good. pt does not take blood pressure at home, she does take her meds daily but not this morning    HPI Jennifer Monroe is a 52 y.o. female here today for follow up of HTN.  She reports she is doing well with losartan.  She has not taken medication today yet as she wanted to see what BP was without medication.  She is not checking BP at home.  She has been working on lifestyle change including walking around 2 miles per day and limiting salt intake.  Sometimes gets off track with eating due to traveling for her children's hockey games.   She denies chest pain, shortness of breath, palpitations, headache, vision change or edema.   ROS:  A comprehensive ROS was completed and negative except as noted per HPI  No Known Allergies  Past Medical History:  Diagnosis Date  . Cancer ([place])    Cervical  . Hypertension     Past Surgical History:  Procedure Laterality Date  . CESAREAN SECTION    . HERNIA REPAIR    . LEEP     52 years old-treated cervical cancer    Social History   Socioeconomic History  . Marital status: Married    Spouse name: Not on file  . Number of children: Not on file  . Years of education: Not on file  . Highest education level: Not on file  Occupational History  . Not on file  Social Needs  . Financial resource strain: Not on file  . Food insecurity:    Worry: Not on file    Inability: Not on file  . Transportation needs:    Medical: Not on file    Non-medical: Not on file  Tobacco Use  . Smoking status: Former Smoker    Types: Cigarettes  . Smokeless tobacco: Never Used  Substance and Sexual Activity  . Alcohol use: No  . Drug use: No  . Sexual activity: Yes  Lifestyle  . Physical activity:    Days per week: Not on file    Minutes per session: Not on file  .  Stress: Not on file  Relationships  . Social connections:    Talks on phone: Not on file    Gets together: Not on file    Attends religious service: Not on file    Active member of club or organization: Not on file    Attends meetings of clubs or organizations: Not on file    Relationship status: Not on file  Other Topics Concern  . Not on file  Social History Narrative  . Not on file    Family History  Problem Relation Age of Onset  . Cancer Mother   . Hypertension Mother   . Stroke Mother   . Kidney disease Father   . Diabetes Father   . Stroke Father   . Asthma Sister     Health Maintenance  Topic Date Due  . HIV Screening  10/24/1981  . TETANUS/TDAP  10/24/1985  . PAP SMEAR  10/25/1987  . COLONOSCOPY  10/24/2016  . MAMMOGRAM  07/09/2020  . INFLUENZA VACCINE  Completed    ----------------------------------------------------------------------------------------------------------------------------------------------------------------------------------------------------------------- Physical Exam BP (!) 150/98 (BP Location: Right Arm, Patient Position: Sitting, Cuff Size: Large)   Pulse 89  Temp (!) 97.4 F (36.3 C) (Oral)   Ht 5' (1.524 m)   Wt 250 lb 9.6 oz (113.7 kg)   LMP 08/24/2012   SpO2 98%   BMI 48.94 kg/m   Physical Exam  Constitutional: She is oriented to person, place, and time. She appears well-nourished. No distress.  HENT:  Head: Normocephalic and atraumatic.  Mouth/Throat: Oropharynx is clear and moist.  Eyes: No scleral icterus.  Neck: Neck supple.  Cardiovascular: Normal rate, regular rhythm, normal heart sounds and intact distal pulses.  Pulmonary/Chest: Effort normal and breath sounds normal.  Musculoskeletal: She exhibits no edema.  Neurological: She is alert and oriented to person, place, and time.  Skin: Skin is warm and dry. No rash noted.  Psychiatric: She has a normal mood and affect. Her behavior is normal.     ------------------------------------------------------------------------------------------------------------------------------------------------------------------------------------------------------------------- Assessment and Plan  Essential hypertension -BP elevated today however has not taken medication. -Encouraged to take daily and monitor BP at home.  -Continue to work on lifestyle change.  -F/u in 3-4 months.

## 2018-07-28 NOTE — Assessment & Plan Note (Signed)
-  BP elevated today however has not taken medication. -Encouraged to take daily and monitor BP at home.  -Continue to work on lifestyle change.  -F/u in 3-4 months.

## 2018-08-11 ENCOUNTER — Telehealth: Payer: Self-pay | Admitting: Emergency Medicine

## 2018-08-11 NOTE — Telephone Encounter (Signed)
Left a VM for patient inquiring about Cologuard and whether or not patient wanted to proceed with the cologuard or if patient did not receive the kit.

## 2018-10-09 DIAGNOSIS — M25562 Pain in left knee: Secondary | ICD-10-CM | POA: Diagnosis not present

## 2018-10-09 DIAGNOSIS — M94261 Chondromalacia, right knee: Secondary | ICD-10-CM | POA: Diagnosis not present

## 2018-10-09 DIAGNOSIS — M25561 Pain in right knee: Secondary | ICD-10-CM | POA: Diagnosis not present

## 2018-10-09 DIAGNOSIS — M94262 Chondromalacia, left knee: Secondary | ICD-10-CM | POA: Diagnosis not present

## 2018-11-26 ENCOUNTER — Ambulatory Visit: Payer: BLUE CROSS/BLUE SHIELD | Admitting: Family Medicine

## 2018-12-28 ENCOUNTER — Telehealth: Payer: Self-pay | Admitting: Family Medicine

## 2018-12-28 NOTE — Telephone Encounter (Signed)
Called to see if patient would like to reschedule appt from 11/26/2018 for a virtual visit, left message.

## 2019-03-15 IMAGING — MG DIGITAL SCREENING BILATERAL MAMMOGRAM WITH TOMO AND CAD
6 of 12 series · 6 of 36 positions shown · non-contrast
Comparison: Previous exam(s).

CLINICAL DATA: Screening.

EXAM:
DIGITAL SCREENING BILATERAL MAMMOGRAM WITH TOMO AND CAD

[R CC synth-2D (1 of 2)]
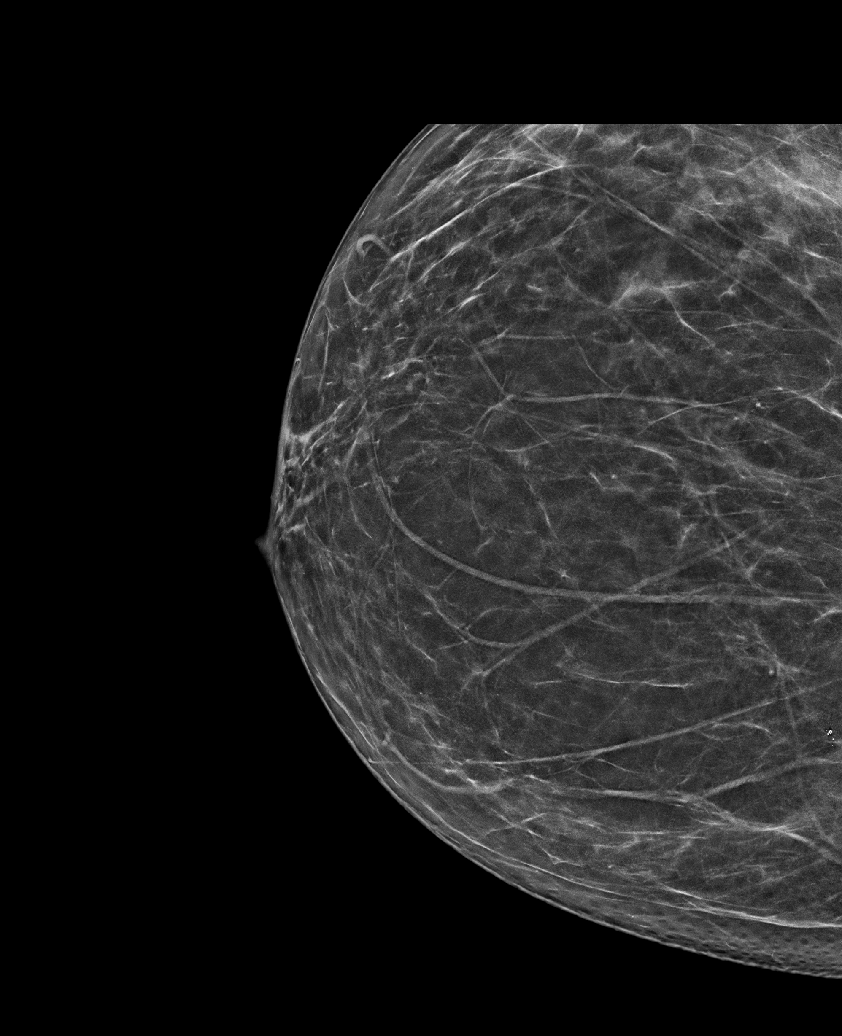

[R MLO synth-2D]
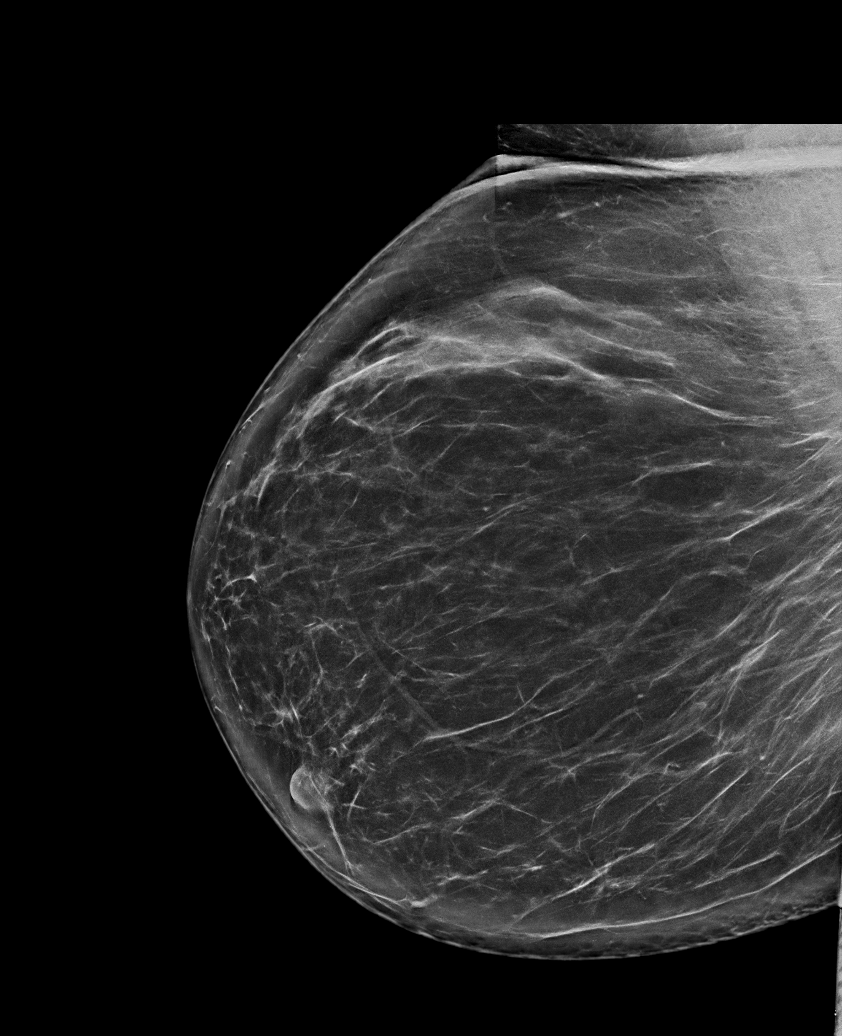

[R CV synth-2D]
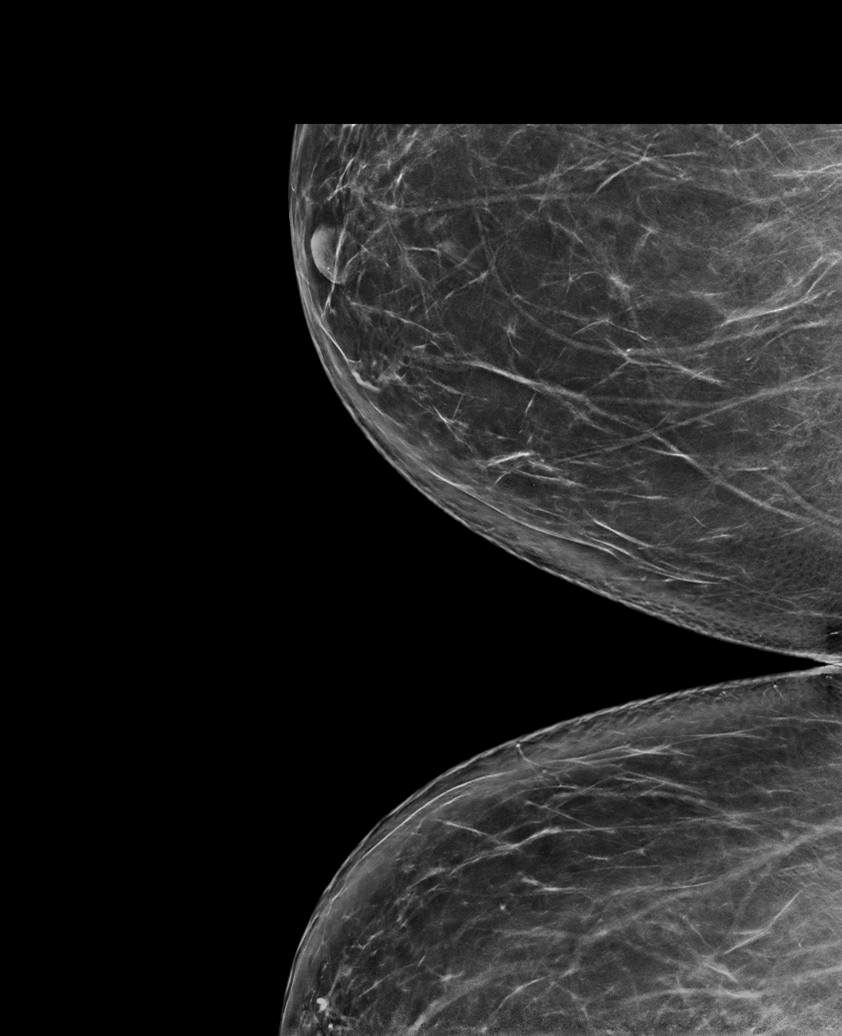

[L CC synth-2D]
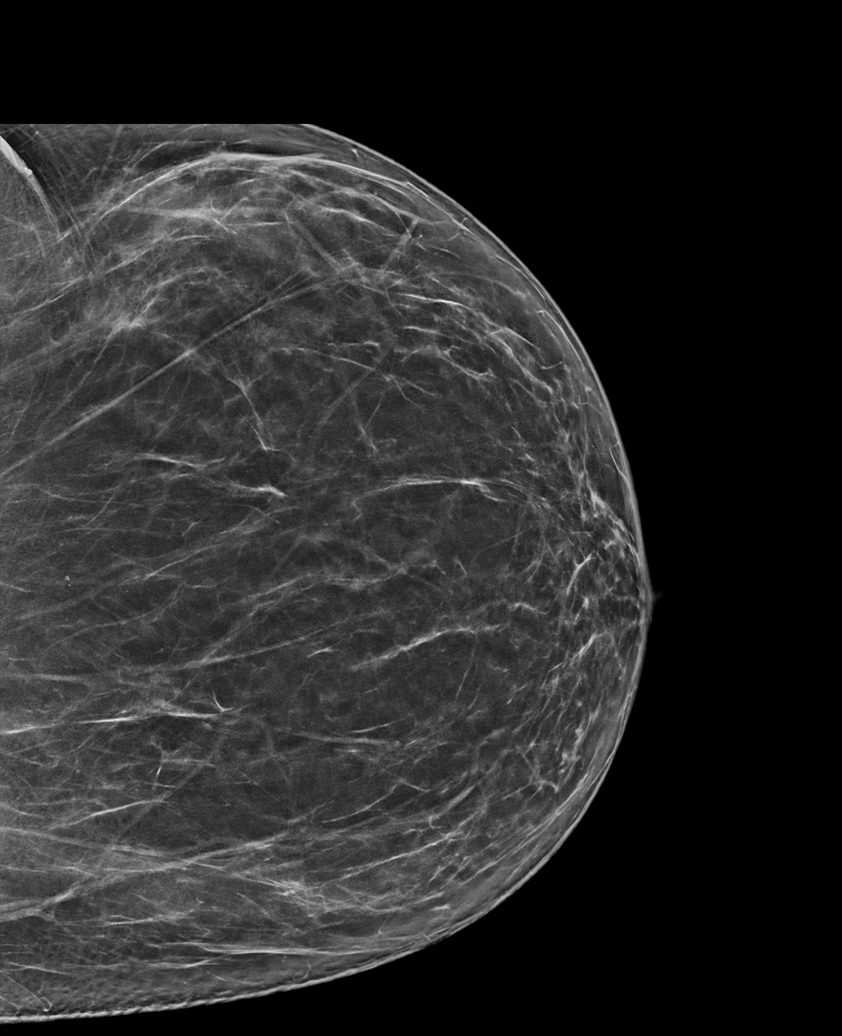

[L MLO synth-2D]
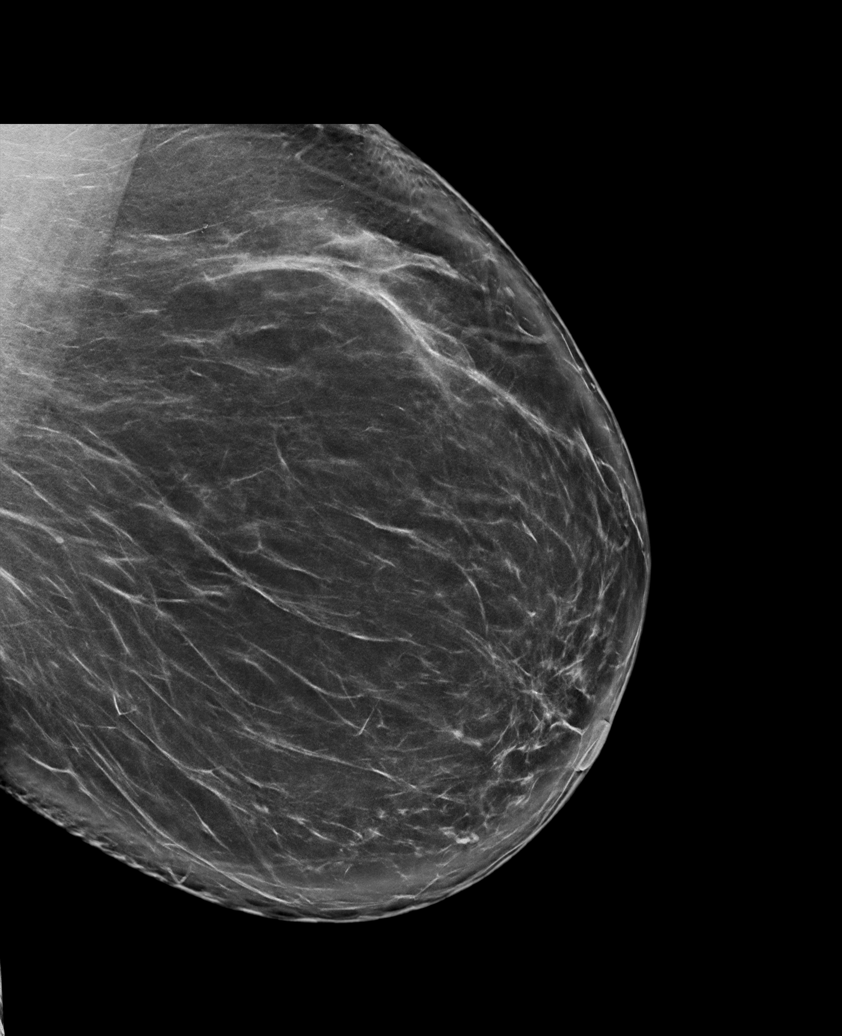

[R CC synth-2D (2 of 2)]
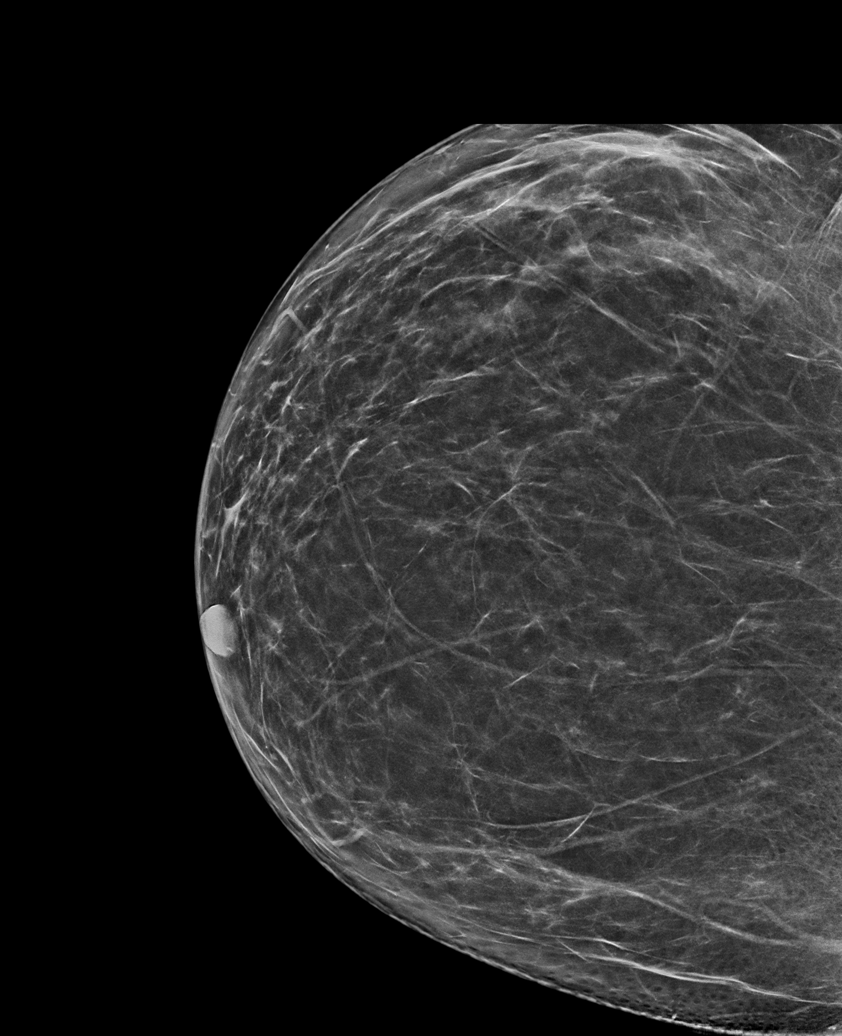

[6 of 36 positions shown; findings below may reference images not displayed]

ACR Breast Density Category b: There are scattered areas of
fibroglandular density.
FINDINGS: There are no findings suspicious for malignancy. Images were
processed with CAD.
IMPRESSION: No mammographic evidence of malignancy. A result letter of this
screening mammogram will be mailed directly to the patient.

RECOMMENDATION:
Screening mammogram in one year. (Code:[id number])

BI-RADS CATEGORY  1: Negative.

## 2019-07-26 ENCOUNTER — Other Ambulatory Visit: Payer: Self-pay | Admitting: Family Medicine

## 2019-07-30 NOTE — Telephone Encounter (Signed)
Please refill x30 day and schedule f/u.

## 2019-08-19 ENCOUNTER — Other Ambulatory Visit: Payer: Self-pay

## 2019-08-19 DIAGNOSIS — Z20822 Contact with and (suspected) exposure to covid-19: Secondary | ICD-10-CM

## 2019-08-22 LAB — NOVEL CORONAVIRUS, NAA: SARS-CoV-2, NAA: NOT DETECTED

## 2019-12-02 ENCOUNTER — Other Ambulatory Visit: Payer: Self-pay

## 2019-12-02 ENCOUNTER — Telehealth (INDEPENDENT_AMBULATORY_CARE_PROVIDER_SITE_OTHER): Payer: BC Managed Care – PPO | Admitting: Nurse Practitioner

## 2019-12-02 ENCOUNTER — Encounter: Payer: Self-pay | Admitting: Nurse Practitioner

## 2019-12-02 VITALS — Ht 60.0 in | Wt 245.0 lb

## 2019-12-02 DIAGNOSIS — R42 Dizziness and giddiness: Secondary | ICD-10-CM | POA: Diagnosis not present

## 2019-12-02 DIAGNOSIS — I10 Essential (primary) hypertension: Secondary | ICD-10-CM

## 2019-12-02 MED ORDER — LOSARTAN POTASSIUM 25 MG PO TABS: 25.0000 mg | ORAL_TABLET | Freq: Every day | ORAL | 0 refills | Status: DC

## 2019-12-02 MED ORDER — MECLIZINE HCL 12.5 MG PO TABS: 12.5000 mg | ORAL_TABLET | Freq: Three times a day (TID) | ORAL | 0 refills | Status: DC | PRN

## 2019-12-02 NOTE — Progress Notes (Signed)
Virtual Visit via Video Note  I connected with@ on 12/02/19 at 10:00 AM EDT by a video enabled telemedicine application and verified that I am speaking with the correct person using two identifiers.  Location: Patient:Home Provider: Office Participants: patient and provider  I discussed the limitations of evaluation and management by telemedicine and the availability of in person appointments. I also discussed with the patient that there may be a patient responsible charge related to this service. The patient expressed understanding and agreed to proceed.  CC:pt reported her right ear feeling different//pt stated that she gets dizzy if she moves her head or look up or down//pt states lymph nodes feel swollen on right side//ear been going on for couple of weeks//Pt reported she needs BP meds refilled shes been out for awhile//pt said no means to get vitals//first pzifer vaccine covid 3/10  History of Present Illness: Dizziness This is a new problem. The current episode started in the past 7 days. The problem occurs intermittently. The problem has been waxing and waning. Associated symptoms include vertigo. Pertinent negatives include no abdominal pain, anorexia, arthralgias, change in bowel habit, chest pain, chills, congestion, coughing, diaphoresis, fatigue, fever, headaches, joint swelling, myalgias, nausea, neck pain, numbness, rash, sore throat, swollen glands, urinary symptoms, visual change, vomiting or weakness. Associated symptoms comments: Denies any ear pain or tinnitus or drainage or jaw pain.. The symptoms are aggravated by twisting and bending. She has tried rest for the symptoms. The treatment provided significant relief.  dizziness is worse in AM, triggered by head movement up or down.   HTN: Out of losartan x 57months. Does not check BP at home. Does not maintain DASH diet.  Observations/Objective: Physical Exam  Constitutional: She is oriented to person, place, and time. No  distress.  HENT:  Right Ear: External ear normal. No mastoid tenderness.  Left Ear: External ear normal. No mastoid tenderness.  Pulmonary/Chest: Effort normal.  Musculoskeletal:     Cervical back: Normal range of motion and neck supple.  Neurological: She is alert and oriented to person, place, and time.  Skin: Skin is warm and dry. No rash noted.  Psychiatric: She has a normal mood and affect. Her behavior is normal. Thought content normal.   Assessment and Plan: Kissy was seen today for ear fullness.  Diagnoses and all orders for this visit:  Essential hypertension -     losartan (COZAAR) 25 MG tablet; Take 1 tablet (25 mg total) by mouth daily. Maintain upcoming appt  Vertigo -     meclizine (ANTIVERT) 12.5 MG tablet; Take 1 tablet (12.5 mg total) by mouth 3 (three) times daily as needed for dizziness (do not use for more than 3days).   Follow Up Instructions: See avs   I discussed the assessment and treatment plan with the patient. The patient was provided an opportunity to ask questions and all were answered. The patient agreed with the plan and demonstrated an understanding of the instructions.   The patient was advised to call back or seek an in-person evaluation if the symptoms worsen or if the condition fails to improve as anticipated.  Wilfred Lacy, NP

## 2019-12-02 NOTE — Patient Instructions (Addendum)
Maintain adequate oral hydration Use meclizine as directed. Look up vertigo exercise and perform once a day. F/up in 1week Resume losartan, new rx sent.  Benign Positional Vertigo Vertigo is the feeling that you or your surroundings are moving when they are not. Benign positional vertigo is the most common form of vertigo. This is usually a harmless condition (benign). This condition is positional. This means that symptoms are triggered by certain movements and positions. This condition can be dangerous if it occurs while you are doing something that could cause harm to you or others. This includes activities such as driving or operating machinery. What are the causes? In many cases, the cause of this condition is not known. It may be caused by a disturbance in an area of the inner ear that helps your brain to sense movement and balance. This disturbance can be caused by:  Viral infection (labyrinthitis).  Head injury.  Repetitive motion, such as jumping, dancing, or running. What increases the risk? You are more likely to develop this condition if:  You are a woman.  You are 53 years of age or older. What are the signs or symptoms? Symptoms of this condition usually happen when you move your head or your eyes in different directions. Symptoms may start suddenly, and usually last for less than a minute. They include:  Loss of balance and falling.  Feeling like you are spinning or moving.  Feeling like your surroundings are spinning or moving.  Nausea and vomiting.  Blurred vision.  Dizziness.  Involuntary eye movement (nystagmus). Symptoms can be mild and cause only minor problems, or they can be severe and interfere with daily life. Episodes of benign positional vertigo may return (recur) over time. Symptoms may improve over time. How is this diagnosed? This condition may be diagnosed based on:  Your medical history.  Physical exam of the head, neck, and ears.  Tests,  such as: ? MRI. ? CT scan. ? Eye movement tests. Your health care provider may ask you to change positions quickly while he or she watches you for symptoms of benign positional vertigo, such as nystagmus. Eye movement may be tested with a variety of exams that are designed to evaluate or stimulate vertigo. ? An electroencephalogram (EEG). This records electrical activity in your brain. ? Hearing tests. You may be referred to a health care provider who specializes in ear, nose, and throat (ENT) problems (otolaryngologist) or a provider who specializes in disorders of the nervous system (neurologist). How is this treated?  This condition may be treated in a session in which your health care provider moves your head in specific positions to adjust your inner ear back to normal. Treatment for this condition may take several sessions. Surgery may be needed in severe cases, but this is rare. In some cases, benign positional vertigo may resolve on its own in 2-4 weeks. Follow these instructions at home: Safety  Move slowly. Avoid sudden body or head movements or certain positions, as told by your health care provider.  Avoid driving until your health care provider says it is safe for you to do so.  Avoid operating heavy machinery until your health care provider says it is safe for you to do so.  Avoid doing any tasks that would be dangerous to you or others if vertigo occurs.  If you have trouble walking or keeping your balance, try using a cane for stability. If you feel dizzy or unstable, sit down right away.  Return to  your normal activities as told by your health care provider. Ask your health care provider what activities are safe for you. General instructions  Take over-the-counter and prescription medicines only as told by your health care provider.  Drink enough fluid to keep your urine pale yellow.  Keep all follow-up visits as told by your health care provider. This is  important. Contact a health care provider if:  You have a fever.  Your condition gets worse or you develop new symptoms.  Your family or friends notice any behavioral changes.  You have nausea or vomiting that gets worse.  You have numbness or a "pins and needles" sensation. Get help right away if you:  Have difficulty speaking or moving.  Are always dizzy.  Faint.  Develop severe headaches.  Have weakness in your legs or arms.  Have changes in your hearing or vision.  Develop a stiff neck.  Develop sensitivity to light. Summary  Vertigo is the feeling that you or your surroundings are moving when they are not. Benign positional vertigo is the most common form of vertigo.  The cause of this condition is not known. It may be caused by a disturbance in an area of the inner ear that helps your brain to sense movement and balance.  Symptoms include loss of balance and falling, feeling that you or your surroundings are moving, nausea and vomiting, and blurred vision.  This condition can be diagnosed based on symptoms, physical exam, and other tests, such as MRI, CT scan, eye movement tests, and hearing tests.  Follow safety instructions as told by your health care provider. You will also be told when to contact your health care provider in case of problems. This information is not intended to replace advice given to you by your health care provider. Make sure you discuss any questions you have with your health care provider. Document Revised: 02/11/2018 Document Reviewed: 02/11/2018 Elsevier Patient Education  [employer] [place].

## 2019-12-07 ENCOUNTER — Other Ambulatory Visit: Payer: Self-pay

## 2019-12-08 ENCOUNTER — Encounter: Payer: Self-pay | Admitting: Nurse Practitioner

## 2019-12-08 ENCOUNTER — Ambulatory Visit (INDEPENDENT_AMBULATORY_CARE_PROVIDER_SITE_OTHER): Payer: BC Managed Care – PPO | Admitting: Nurse Practitioner

## 2019-12-08 VITALS — BP 132/82 | HR 100 | Temp 97.1°F | Ht 60.0 in | Wt 273.0 lb

## 2019-12-08 DIAGNOSIS — E1165 Type 2 diabetes mellitus with hyperglycemia: Secondary | ICD-10-CM

## 2019-12-08 DIAGNOSIS — I10 Essential (primary) hypertension: Secondary | ICD-10-CM

## 2019-12-08 DIAGNOSIS — Z13220 Encounter for screening for lipoid disorders: Secondary | ICD-10-CM

## 2019-12-08 DIAGNOSIS — Z136 Encounter for screening for cardiovascular disorders: Secondary | ICD-10-CM | POA: Diagnosis not present

## 2019-12-08 DIAGNOSIS — Z1211 Encounter for screening for malignant neoplasm of colon: Secondary | ICD-10-CM

## 2019-12-08 DIAGNOSIS — Z0001 Encounter for general adult medical examination with abnormal findings: Secondary | ICD-10-CM

## 2019-12-08 LAB — BASIC METABOLIC PANEL
BUN: 14 mg/dL (ref 6–23)
CO2: 28 mEq/L (ref 19–32)
Calcium: 9.1 mg/dL (ref 8.4–10.5)
Chloride: 99 mEq/L (ref 96–112)
Creatinine, Ser: 0.68 mg/dL (ref 0.40–1.20)
GFR: 90.47 mL/min (ref >60.00)
Glucose, Bld: 249 mg/dL — ABNORMAL HIGH (ref 70–99)
Potassium: 4.3 mEq/L (ref 3.5–5.1)
Sodium: 134 mEq/L — ABNORMAL LOW (ref 135–145)

## 2019-12-08 LAB — CBC
HCT: 40.5 % (ref 36.0–46.0)
Hemoglobin: 13.6 g/dL (ref 12.0–15.0)
MCHC: 33.5 g/dL (ref 30.0–36.0)
MCV: 90.4 fl (ref 78.0–100.0)
Platelets: 312 10*3/uL (ref 150.0–400.0)
RBC: 4.48 Mil/uL (ref 3.87–5.11)
RDW: 13.3 % (ref 11.5–15.5)
WBC: 9.8 10*3/uL (ref 4.0–10.5)

## 2019-12-08 LAB — LIPID PANEL
Cholesterol: 165 mg/dL (ref 0–200)
HDL: 37.2 mg/dL — ABNORMAL LOW (ref >39.00)
LDL Cholesterol: 98 mg/dL (ref 0–99)
NonHDL: 127.74
Total CHOL/HDL Ratio: 4
Triglycerides: 147 mg/dL (ref 0.0–149.0)
VLDL: 29.4 mg/dL (ref 0.0–40.0)

## 2019-12-08 LAB — HEPATIC FUNCTION PANEL
ALT: 18 U/L (ref 0–35)
AST: 13 U/L (ref 0–37)
Albumin: 4.1 g/dL (ref 3.5–5.2)
Alkaline Phosphatase: 107 U/L (ref 39–117)
Bilirubin, Direct: 0.1 mg/dL (ref 0.0–0.3)
Total Bilirubin: 0.5 mg/dL (ref 0.2–1.2)
Total Protein: 7.4 g/dL (ref 6.0–8.3)

## 2019-12-08 LAB — TSH: TSH: 1.43 u[IU]/mL (ref 0.35–4.50)

## 2019-12-08 MED ORDER — ASPIRIN EC 81 MG PO TBEC: 81.0000 mg | DELAYED_RELEASE_TABLET | Freq: Every day | ORAL | Status: DC

## 2019-12-08 MED ORDER — LOSARTAN POTASSIUM 25 MG PO TABS: 25.0000 mg | ORAL_TABLET | Freq: Every day | ORAL | 3 refills | Status: DC

## 2019-12-08 NOTE — Progress Notes (Addendum)
Subjective:    Patient ID: Margie Ege, female    DOB: September 10, 1966, 54 y.o.   MRN: [id number]  Patient presents today for complete physical  HPI  HTN: BP at goal BP Readings from Last 3 Encounters:  12/08/19 132/82  07/28/18 (!) 150/98  05/28/18 138/76   Sexual History (orientation,birth control, marital status, STD):married, sexually active, postmenopausal, needs repeat PAP and breast exam. FHx of breast cancer ([facility])  Depression/Suicide: Depression screen [facility] 2/9 12/02/2019 07/28/2018  Decreased Interest 0 0  Down, Depressed, Hopeless 0 0  PHQ - 2 Score 0 0   Vision:up to date  Dental:up to date  Immunizations: (TDAP, Hep C screen, Pneumovax, Influenza, zoster)  Health Maintenance  Topic Date Due  . Pneumococcal vaccine  Never done  . Complete foot exam   Never done  . Eye exam for diabetics  Never done  . Tetanus Vaccine  Never done  . Pap Smear  Never done  . Colon Cancer Screening  Never done  . HIV Screening  12/07/2020*  . Flu Shot  04/16/2020  . Hemoglobin A1C  06/22/2020  . Mammogram  07/09/2020  *Topic was postponed. The date shown is not the original due date.   Diet:regular.  Weight:  Wt Readings from Last 3 Encounters:  12/08/19 273 lb (123.8 kg)  12/02/19 245 lb (111.1 kg)  07/28/18 250 lb 9.6 oz (113.7 kg)   Exercise:none  Fall Risk: Fall Risk  12/08/2019 12/02/2019  Falls in the past year? 0 0  Number falls in past yr: 0 0  Injury with Fall? 0 0   Medications and allergies reviewed with patient and updated if appropriate.  Patient Active Problem List   Diagnosis Date Noted  . Type 2 diabetes mellitus with hyperglycemia, without long-term current use of insulin ([place]) 12/22/2019  . Essential hypertension 05/28/2018  . Primary osteoarthritis of both hands 05/28/2018  . Patellofemoral arthralgia of both knees 05/28/2018  . DERMATITIS, CONTACT, NEC 04/07/2007    Current Outpatient Medications on File Prior to Visit  Medication Sig  Dispense Refill  . meclizine (ANTIVERT) 12.5 MG tablet Take 1 tablet (12.5 mg total) by mouth 3 (three) times daily as needed for dizziness (do not use for more than 3days). 9 tablet 0  . olopatadine (PATANOL) 0.1 % ophthalmic solution   0   No current facility-administered medications on file prior to visit.    Past Medical History:  Diagnosis Date  . Cancer ([place])    Cervical  . Hypertension     Past Surgical History:  Procedure Laterality Date  . CESAREAN SECTION    . HERNIA REPAIR    . LEEP     54 years old-treated cervical cancer    Social History   Socioeconomic History  . Marital status: Married    Spouse name: Not on file  . Number of children: Not on file  . Years of education: Not on file  . Highest education level: Not on file  Occupational History  . Not on file  Tobacco Use  . Smoking status: Former Smoker    Types: Cigarettes  . Smokeless tobacco: Never Used  Substance and Sexual Activity  . Alcohol use: No  . Drug use: No  . Sexual activity: Yes    Birth control/protection: Post-menopausal  Other Topics Concern  . Not on file  Social History Narrative  . Not on file   Social Determinants of Health   Financial Resource Strain:   . Difficulty of  Paying Living Expenses:   Food Insecurity:   . Worried About Charity fundraiser in the Last Year:   . Arboriculturist in the Last Year:   Transportation Needs:   . Film/video editor (Medical):   Marland Kitchen Lack of Transportation (Non-Medical):   Physical Activity:   . Days of Exercise per Week:   . Minutes of Exercise per Session:   Stress:   . Feeling of Stress :   Social Connections:   . Frequency of Communication with Friends and Family:   . Frequency of Social Gatherings with Friends and Family:   . Attends Religious Services:   . Active Member of Clubs or Organizations:   . Attends Archivist Meetings:   Marland Kitchen Marital Status:    Family History  Problem Relation Age of Onset  .  Hypertension Mother   . Stroke Mother 55       embolic stroke  . Kidney disease Father   . Diabetes Father   . Stroke Father   . Asthma Sister   . Colon polyps Sister   . Stroke Maternal Aunt 58       embolic stroke  . Cancer Paternal Aunt 45       breast       Review of Systems  Constitutional: Negative for fever, malaise/fatigue and weight loss.  HENT: Negative for congestion and sore throat.   Eyes:       Negative for visual changes  Respiratory: Negative for cough and shortness of breath.   Cardiovascular: Negative for chest pain, palpitations and leg swelling.  Gastrointestinal: Negative for blood in stool, constipation, diarrhea and heartburn.  Genitourinary: Negative for dysuria, frequency and urgency.  Musculoskeletal: Negative for falls, joint pain and myalgias.  Skin: Negative for rash.  Neurological: Negative for dizziness, sensory change and headaches.  Endo/Heme/Allergies: Does not bruise/bleed easily.  Psychiatric/Behavioral: Negative for depression, substance abuse and suicidal ideas. The patient is not nervous/anxious.    Objective:   Vitals:   12/08/19 1026  BP: 132/82  Pulse: 100  Temp: (!) 97.1 F (36.2 C)  SpO2: 97%    Body mass index is 53.32 kg/m.   Physical Examination:  Physical Exam Exam conducted with a chaperone present.  Constitutional:      Appearance: She is obese.  HENT:     Right Ear: Tympanic membrane, ear canal and external ear normal.     Left Ear: Tympanic membrane, ear canal and external ear normal.  Eyes:     Extraocular Movements: Extraocular movements intact.     Conjunctiva/sclera: Conjunctivae normal.  Cardiovascular:     Rate and Rhythm: Normal rate and regular rhythm.     Pulses: Normal pulses.     Heart sounds: Normal heart sounds.  Pulmonary:     Effort: Pulmonary effort is normal.     Breath sounds: Normal breath sounds.  Chest:     Breasts:        Right: Normal.        Left: Normal.  Abdominal:      General: Bowel sounds are normal.     Palpations: Abdomen is soft.  Genitourinary:    General: Normal vulva.     Pubic Area: No rash.      Labia:        Right: No rash or tenderness.        Left: No rash or tenderness.      Comments: Unable to completed pelvic exam with speculum  due to pain.  Musculoskeletal:     Cervical back: Normal range of motion and neck supple.     Right lower leg: No edema.     Left lower leg: No edema.  Lymphadenopathy:     Cervical: No cervical adenopathy.     Upper Body:     Right upper body: No supraclavicular, axillary or pectoral adenopathy.     Left upper body: No supraclavicular, axillary or pectoral adenopathy.  Skin:    General: Skin is warm and dry.  Neurological:     Mental Status: She is alert and oriented to person, place, and time.  Psychiatric:        Mood and Affect: Mood normal.        Behavior: Behavior normal.        Thought Content: Thought content normal.    ASSESSMENT and PLAN: This visit occurred during the SARS-CoV-2 public health emergency.  Safety protocols were in place, including screening questions prior to the visit, additional usage of staff PPE, and extensive cleaning of exam room while observing appropriate contact time as indicated for disinfecting solutions.   Lola was seen today for establish care.  Diagnoses and all orders for this visit:  Encounter for preventative adult health care exam with abnormal findings -     Hepatic function panel -     CBC -     TSH -     Lipid panel -     Ambulatory referral to Gastroenterology  Essential hypertension -     Basic metabolic panel -     aspirin EC 81 MG tablet; Take 1 tablet (81 mg total) by mouth daily. -     losartan (COZAAR) 25 MG tablet; Take 1 tablet (25 mg total) by mouth daily. -     Referral to Nutrition and Diabetes Services  Encounter for lipid screening for cardiovascular disease -     Lipid panel  Colon cancer screening -     Ambulatory referral  to Gastroenterology  Type 2 diabetes mellitus with hyperglycemia, without long-term current use of insulin ([place]) -     Basic metabolic panel; Future -     Hemoglobin A1c; Future -     metFORMIN (GLUCOPHAGE) 500 MG tablet; Take 1 tablet (500 mg total) by mouth 2 (two) times daily with a meal. -     blood glucose meter kit and supplies KIT; Dispense based on patient and insurance preference. Use 3x/week (fasting). ICD E11.65 -     Referral to Nutrition and Diabetes Services   Type 2 diabetes mellitus with hyperglycemia, without long-term current use of insulin ([place]) New diagnosis with HgbA1c of 8.2 Start metformin Provided glucometer Referral to nutritionist F/up in 70month     Problem List Items Addressed This Visit      Cardiovascular and Mediastinum   Essential hypertension   Relevant Medications   aspirin EC 81 MG tablet   losartan (COZAAR) 25 MG tablet   Other Relevant Orders   Basic metabolic panel (Completed)   Referral to Nutrition and Diabetes Services     Endocrine   Type 2 diabetes mellitus with hyperglycemia, without long-term current use of insulin ([facility])    New diagnosis with HgbA1c of 8.2 Start metformin Provided glucometer Referral to nutritionist F/up in 343month     Relevant Medications   aspirin EC 81 MG tablet   losartan (COZAAR) 25 MG tablet   metFORMIN (GLUCOPHAGE) 500 MG tablet   blood glucose meter kit and  supplies KIT   Other Relevant Orders   Basic metabolic panel (Completed)   Hemoglobin A1c (Completed)   Referral to Nutrition and Diabetes Services    Other Visit Diagnoses    Encounter for preventative adult health care exam with abnormal findings    -  Primary   Relevant Orders   Hepatic function panel (Completed)   CBC (Completed)   TSH (Completed)   Lipid panel (Completed)   Ambulatory referral to Gastroenterology   Encounter for lipid screening for cardiovascular disease       Relevant Orders   Lipid panel (Completed)   Colon  cancer screening       Relevant Orders   Ambulatory referral to Gastroenterology      Follow up: Return in about 6 months (around 06/09/2020) for HTN (F2F).  Wilfred Lacy, NP

## 2019-12-08 NOTE — Patient Instructions (Addendum)
Normal cbc, TSH, renal and liver function Elevated glucose. You need to return to lab for repeat glucose and hgbA1c test (fasting)  Normal renal function Elevated glucose with hgbA1c of 8.2: this indicates DM-type 2 Start metformin '500mg'$  BID and glucose check 3x/week (fasting) Metformin and glucometer sent Are you ok with referral to nutritionist?  Let me know where you want Gyn referral sent.  You will be contacted to schedule appt with GI for colonoscopy  Call [facility] for repeat mammogram.   Preventive Care 65-9 Years Old, Female Preventive care refers to visits with your health care provider and lifestyle choices that can promote health and wellness. This includes:  A yearly physical exam. This may also be called an annual well check.  Regular dental visits and eye exams.  Immunizations.  Screening for certain conditions.  Healthy lifestyle choices, such as eating a healthy diet, getting regular exercise, not using drugs or products that contain nicotine and tobacco, and limiting alcohol use. What can I expect for my preventive care visit? Physical exam Your health care provider will check your:  Height and weight. This may be used to calculate body mass index (BMI), which tells if you are at a healthy weight.  Heart rate and blood pressure.  Skin for abnormal spots. Counseling Your health care provider may ask you questions about your:  Alcohol, tobacco, and drug use.  Emotional well-being.  Home and relationship well-being.  Sexual activity.  Eating habits.  Work and work Statistician.  Method of birth control.  Menstrual cycle.  Pregnancy history. What immunizations do I need?  Influenza (flu) vaccine  This is recommended every year. Tetanus, diphtheria, and pertussis (Tdap) vaccine  You may need a Td booster every 10 years. Varicella (chickenpox) vaccine  You may need this if you have not been vaccinated. Zoster (shingles)  vaccine  You may need this after age 68. Measles, mumps, and rubella (MMR) vaccine  You may need at least one dose of MMR if you were born in 1957 or later. You may also need a second dose. Pneumococcal conjugate (PCV13) vaccine  You may need this if you have certain conditions and were not previously vaccinated. Pneumococcal polysaccharide (PPSV23) vaccine  You may need one or two doses if you smoke cigarettes or if you have certain conditions. Meningococcal conjugate (MenACWY) vaccine  You may need this if you have certain conditions. Hepatitis A vaccine  You may need this if you have certain conditions or if you travel or work in places where you may be exposed to hepatitis A. Hepatitis B vaccine  You may need this if you have certain conditions or if you travel or work in places where you may be exposed to hepatitis B. Haemophilus influenzae type b (Hib) vaccine  You may need this if you have certain conditions. Human papillomavirus (HPV) vaccine  If recommended by your health care provider, you may need three doses over 6 months. You may receive vaccines as individual doses or as more than one vaccine together in one shot (combination vaccines). Talk with your health care provider about the risks and benefits of combination vaccines. What tests do I need? Blood tests  Lipid and cholesterol levels. These may be checked every 5 years, or more frequently if you are over 23 years old.  Hepatitis C test.  Hepatitis B test. Screening  Lung cancer screening. You may have this screening every year starting at age 55 if you have a 30-pack-year history of smoking  and currently smoke or have quit within the past 15 years.  Colorectal cancer screening. All adults should have this screening starting at age 53 and continuing until age 24. Your health care provider may recommend screening at age 40 if you are at increased risk. You will have tests every 1-10 years, depending on your  results and the type of screening test.  Diabetes screening. This is done by checking your blood sugar (glucose) after you have not eaten for a while (fasting). You may have this done every 1-3 years.  Mammogram. This may be done every 1-2 years. Talk with your health care provider about when you should start having regular mammograms. This may depend on whether you have a family history of breast cancer.  BRCA-related cancer screening. This may be done if you have a family history of breast, ovarian, tubal, or peritoneal cancers.  Pelvic exam and Pap test. This may be done every 3 years starting at age 79. Starting at age 40, this may be done every 5 years if you have a Pap test in combination with an HPV test. Other tests  Sexually transmitted disease (STD) testing.  Bone density scan. This is done to screen for osteoporosis. You may have this scan if you are at high risk for osteoporosis. Follow these instructions at home: Eating and drinking  Eat a diet that includes fresh fruits and vegetables, whole grains, lean protein, and low-fat dairy.  Take vitamin and mineral supplements as recommended by your health care provider.  Do not drink alcohol if: ? Your health care provider tells you not to drink. ? You are pregnant, may be pregnant, or are planning to become pregnant.  If you drink alcohol: ? Limit how much you have to 0-1 drink a day. ? Be aware of how much alcohol is in your drink. In the [place] one drink equals one 12 oz bottle of beer (355 mL), one 5 oz glass of wine (148 mL), or one 1 oz glass of hard liquor (44 mL). Lifestyle  Take daily care of your teeth and gums.  Stay active. Exercise for at least 30 minutes on 5 or more days each week.  Do not use any products that contain nicotine or tobacco, such as cigarettes, e-cigarettes, and chewing tobacco. If you need help quitting, ask your health care provider.  If you are sexually active, practice safe sex. Use a  condom or other form of birth control (contraception) in order to prevent pregnancy and STIs (sexually transmitted infections).  If told by your health care provider, take low-dose aspirin daily starting at age 63. What's next?  Visit your health care provider once a year for a well check visit.  Ask your health care provider how often you should have your eyes and teeth checked.  Stay up to date on all vaccines. This information is not intended to replace advice given to you by your health care provider. Make sure you discuss any questions you have with your health care provider. Document Revised: 05/14/2018 Document Reviewed: 05/14/2018 Elsevier Patient Education  2020 [employer].

## 2019-12-09 ENCOUNTER — Encounter: Payer: Self-pay | Admitting: Nurse Practitioner

## 2019-12-21 ENCOUNTER — Other Ambulatory Visit: Payer: Self-pay

## 2019-12-22 ENCOUNTER — Other Ambulatory Visit (INDEPENDENT_AMBULATORY_CARE_PROVIDER_SITE_OTHER): Payer: BC Managed Care – PPO

## 2019-12-22 DIAGNOSIS — R739 Hyperglycemia, unspecified: Secondary | ICD-10-CM

## 2019-12-22 DIAGNOSIS — E1165 Type 2 diabetes mellitus with hyperglycemia: Secondary | ICD-10-CM | POA: Insufficient documentation

## 2019-12-22 LAB — BASIC METABOLIC PANEL
BUN: 10 mg/dL (ref 6–23)
CO2: 27 mEq/L (ref 19–32)
Calcium: 8.7 mg/dL (ref 8.4–10.5)
Chloride: 103 mEq/L (ref 96–112)
Creatinine, Ser: 0.73 mg/dL (ref 0.40–1.20)
GFR: 83.34 mL/min (ref >60.00)
Glucose, Bld: 140 mg/dL — ABNORMAL HIGH (ref 70–99)
Potassium: 4.7 mEq/L (ref 3.5–5.1)
Sodium: 136 mEq/L (ref 135–145)

## 2019-12-22 LAB — HEMOGLOBIN A1C: Hgb A1c MFr Bld: 8.2 % — ABNORMAL HIGH (ref 4.6–6.5)

## 2019-12-22 MED ORDER — BLOOD GLUCOSE MONITOR KIT: PACK | 0 refills | Status: DC

## 2019-12-22 MED ORDER — METFORMIN HCL 500 MG PO TABS: 500.0000 mg | ORAL_TABLET | Freq: Two times a day (BID) | ORAL | 5 refills | Status: DC

## 2019-12-22 NOTE — Assessment & Plan Note (Addendum)
New diagnosis with HgbA1c of 8.2 Start metformin Provided glucometer Referral for diabetic education entered F/up in 60months

## 2019-12-22 NOTE — Addendum Note (Signed)
Addended by: Wilfred Lacy L on: 12/22/2019 08:16 PM   Modules accepted: Orders

## 2019-12-27 ENCOUNTER — Telehealth: Payer: Self-pay | Admitting: Nurse Practitioner

## 2019-12-27 DIAGNOSIS — N950 Postmenopausal bleeding: Secondary | ICD-10-CM

## 2019-12-27 DIAGNOSIS — E1165 Type 2 diabetes mellitus with hyperglycemia: Secondary | ICD-10-CM

## 2019-12-27 MED ORDER — BLOOD GLUCOSE MONITOR KIT: PACK | 0 refills | Status: AC

## 2019-12-27 NOTE — Telephone Encounter (Signed)
Patient is calling and I requesting a call back regarding medication. CB is [phone]

## 2019-12-27 NOTE — Telephone Encounter (Signed)
Pt notified and verbally understood results.

## 2019-12-27 NOTE — Telephone Encounter (Signed)
Pt called stated she started to take metformin on 12/23/2019 and started to have her period on 12/24/2019 after have not had period for over a year. Pt was wondering if this is normal or what do you advise  Pt mention that glucose meter didn't send into pharmacy. I repinte Rx and fax to pharmacy.

## 2019-12-27 NOTE — Telephone Encounter (Signed)
I am not aware of correlation between metformin and vaginal bleed.  I Entered order for pelvic US

## 2020-02-08 ENCOUNTER — Ambulatory Visit: Payer: BC Managed Care – PPO

## 2020-02-15 ENCOUNTER — Ambulatory Visit: Payer: BC Managed Care – PPO

## 2020-02-22 ENCOUNTER — Ambulatory Visit: Payer: BC Managed Care – PPO

## 2020-07-07 ENCOUNTER — Other Ambulatory Visit: Payer: Self-pay | Admitting: Nurse Practitioner

## 2020-07-07 DIAGNOSIS — E1165 Type 2 diabetes mellitus with hyperglycemia: Secondary | ICD-10-CM

## 2020-09-12 ENCOUNTER — Telehealth: Payer: Self-pay

## 2020-09-12 DIAGNOSIS — E1165 Type 2 diabetes mellitus with hyperglycemia: Secondary | ICD-10-CM

## 2020-09-12 MED ORDER — METFORMIN HCL 500 MG PO TABS: 500.0000 mg | ORAL_TABLET | Freq: Two times a day (BID) | ORAL | 0 refills | Status: DC

## 2020-09-12 NOTE — Telephone Encounter (Signed)
Last OV 12/08/19 Last fill 07/07/20  #60/0 Next OV 09/22/20

## 2020-09-21 ENCOUNTER — Other Ambulatory Visit: Payer: Self-pay

## 2020-09-22 ENCOUNTER — Encounter: Payer: Self-pay | Admitting: Nurse Practitioner

## 2020-09-22 ENCOUNTER — Other Ambulatory Visit: Payer: Self-pay | Admitting: Nurse Practitioner

## 2020-09-22 ENCOUNTER — Ambulatory Visit (INDEPENDENT_AMBULATORY_CARE_PROVIDER_SITE_OTHER): Payer: BC Managed Care – PPO | Admitting: Nurse Practitioner

## 2020-09-22 VITALS — BP 164/100 | HR 88 | Temp 97.5°F | Ht 60.0 in | Wt 246.2 lb

## 2020-09-22 DIAGNOSIS — E1165 Type 2 diabetes mellitus with hyperglycemia: Secondary | ICD-10-CM

## 2020-09-22 DIAGNOSIS — Z23 Encounter for immunization: Secondary | ICD-10-CM

## 2020-09-22 DIAGNOSIS — I10 Essential (primary) hypertension: Secondary | ICD-10-CM | POA: Diagnosis not present

## 2020-09-22 DIAGNOSIS — Z1231 Encounter for screening mammogram for malignant neoplasm of breast: Secondary | ICD-10-CM | POA: Diagnosis not present

## 2020-09-22 LAB — BASIC METABOLIC PANEL
BUN: 14 mg/dL (ref 6–23)
CO2: 29 mEq/L (ref 19–32)
Calcium: 9.4 mg/dL (ref 8.4–10.5)
Chloride: 101 mEq/L (ref 96–112)
Creatinine, Ser: 0.65 mg/dL (ref 0.40–1.20)
GFR: 100.17 mL/min (ref >60.00)
Glucose, Bld: 129 mg/dL — ABNORMAL HIGH (ref 70–99)
Potassium: 4.1 mEq/L (ref 3.5–5.1)
Sodium: 138 mEq/L (ref 135–145)

## 2020-09-22 LAB — HEMOGLOBIN A1C: Hgb A1c MFr Bld: 6.1 % (ref 4.6–6.5)

## 2020-09-22 MED ORDER — LOSARTAN POTASSIUM 25 MG PO TABS
25.0000 mg | ORAL_TABLET | Freq: Two times a day (BID) | ORAL | 3 refills | Status: DC
Start: 2020-09-22 — End: 2021-07-06

## 2020-09-22 MED ORDER — METFORMIN HCL 500 MG PO TABS: 500.0000 mg | ORAL_TABLET | Freq: Two times a day (BID) | ORAL | 1 refills | Status: DC

## 2020-09-22 NOTE — Patient Instructions (Addendum)
Go to lab for blood draw  Increase losartan to 25mg  BID Maintain metformin dose while waiting for lab results  Monitor BP 3x/week in AM. Bring readings to next OV

## 2020-09-22 NOTE — Assessment & Plan Note (Signed)
Improved with metformin and dietary changes hgbA1c of 6.1 today  Maintain metformin dose F/up in 2months

## 2020-09-22 NOTE — Telephone Encounter (Signed)
Patient in today for OV requesting refill on pending Rx okay to refill? Please advise

## 2020-09-22 NOTE — Progress Notes (Signed)
Subjective:  Patient ID: Jennifer Monroe, female    DOB: August 10, 1966  Age: 55 y.o. MRN: [id number]  CC: Follow-up (6 month f/u on DM and HTN. Pt checks blood sugars at home and they are ranging 103-120. Pt is not fasting today. )   HPI Wt Readings from Last 3 Encounters:  09/22/20 246 lb 3.2 oz (111.7 kg)  12/08/19 273 lb (123.8 kg)  12/02/19 245 lb (111.1 kg)   Essential hypertension BP not at goal with losartan Maintains DASH diet BP Readings from Last 3 Encounters:  09/22/20 (!) 164/100  12/08/19 132/82  07/28/18 (!) 150/98   Repeat BMP: normal Increase losartan to 9m BID F/up in 140monthType 2 diabetes mellitus with hyperglycemia, without long-term current use of insulin (HCC) Improved with metformin and dietary changes hgbA1c of 6.1 today  Maintain metformin dose F/up in 54m70month Reviewed past Medical, Social and Family history today.  Outpatient Medications Prior to Visit  Medication Sig Dispense Refill  . aspirin EC 81 MG tablet Take 1 tablet (81 mg total) by mouth daily.    . blood glucose meter kit and supplies KIT Dispense based on patient and insurance preference. Use 3x/week (fasting). ICD E11.65 1 each 0  . metFORMIN (GLUCOPHAGE) 500 MG tablet Take 1 tablet (500 mg total) by mouth 2 (two) times daily with a meal. Need office visit for additional refills 20 tablet 0  . losartan (COZAAR) 25 MG tablet Take 1 tablet (25 mg total) by mouth daily. 90 tablet 3  . meclizine (ANTIVERT) 12.5 MG tablet Take 1 tablet (12.5 mg total) by mouth 3 (three) times daily as needed for dizziness (do not use for more than 3days). (Patient not taking: Reported on 09/22/2020) 9 tablet 0  . olopatadine (PATANOL) 0.1 % ophthalmic solution  (Patient not taking: Reported on 09/22/2020)  0   No facility-administered medications prior to visit.    ROS See HPI  Objective:  BP (!) 164/100 (BP Location: Left Arm, Patient Position: Sitting, Cuff Size: Large)   Pulse 88   Temp (!)  97.5 F (36.4 C) (Temporal)   Ht 5' (1.524 m)   Wt 246 lb 3.2 oz (111.7 kg)   LMP 08/24/2012   SpO2 98%   BMI 48.08 kg/m   Physical Exam Constitutional:      Appearance: She is obese.  Cardiovascular:     Rate and Rhythm: Normal rate.     Pulses: Normal pulses.  Pulmonary:     Effort: Pulmonary effort is normal.  Musculoskeletal:     Right lower leg: No edema.     Left lower leg: No edema.  Neurological:     Mental Status: She is alert and oriented to person, place, and time.     Assessment & Plan:  This visit occurred during the SARS-CoV-2 public health emergency.  Safety protocols were in place, including screening questions prior to the visit, additional usage of staff PPE, and extensive cleaning of exam room while observing appropriate contact time as indicated for disinfecting solutions.   [place] seen today for follow-up.  Diagnoses and all orders for this visit:  Type 2 diabetes mellitus with hyperglycemia, without long-term current use of insulin (HCC) -     Hemoglobin A1c -     Basic metabolic panel  Influenza vaccine needed -     Flu Vaccine QUAD High Dose(Fluad)  Essential hypertension -     losartan (COZAAR) 25 MG tablet; Take 1 tablet (25 mg  total) by mouth in the morning and at bedtime. -     Basic metabolic panel  Encounter for screening mammogram for malignant neoplasm of breast -     MM DIGITAL SCREENING BILATERAL; Future    Problem List Items Addressed This Visit      Cardiovascular and Mediastinum   Essential hypertension    BP not at goal with losartan Maintains DASH diet BP Readings from Last 3 Encounters:  09/22/20 (!) 164/100  12/08/19 132/82  07/28/18 (!) 150/98   Repeat BMP: normal Increase losartan to 79m BID F/up in 177month    Relevant Medications   losartan (COZAAR) 25 MG tablet   Other Relevant Orders   Basic metabolic panel (Completed)     Endocrine   Type 2 diabetes mellitus with hyperglycemia, without  long-term current use of insulin (HCC) - Primary    Improved with metformin and dietary changes hgbA1c of 6.1 today  Maintain metformin dose F/up in 2m65month    Relevant Medications   losartan (COZAAR) 25 MG tablet   Other Relevant Orders   Hemoglobin A1c (Completed)   Basic metabolic panel (Completed)    Other Visit Diagnoses    Influenza vaccine needed       Relevant Orders   Flu Vaccine QUAD High Dose(Fluad) (Completed)   Encounter for screening mammogram for malignant neoplasm of breast       Relevant Orders   MM DIGITAL SCREENING BILATERAL      Follow-up: Return in about 4 weeks (around 10/20/2020) for HTN (foot exam.  ChaWilfred LacyP

## 2020-09-22 NOTE — Assessment & Plan Note (Signed)
BP not at goal with losartan Maintains DASH diet BP Readings from Last 3 Encounters:  09/22/20 (!) 164/100  12/08/19 132/82  07/28/18 (!) 150/98   Repeat BMP: normal Increase losartan to 25mg  BID F/up in 1month

## 2021-03-21 ENCOUNTER — Other Ambulatory Visit: Payer: Self-pay | Admitting: Nurse Practitioner

## 2021-03-21 DIAGNOSIS — E1165 Type 2 diabetes mellitus with hyperglycemia: Secondary | ICD-10-CM

## 2021-06-13 ENCOUNTER — Other Ambulatory Visit: Payer: Self-pay | Admitting: Nurse Practitioner

## 2021-06-13 DIAGNOSIS — E1165 Type 2 diabetes mellitus with hyperglycemia: Secondary | ICD-10-CM

## 2021-06-17 ENCOUNTER — Other Ambulatory Visit: Payer: Self-pay | Admitting: Nurse Practitioner

## 2021-06-17 DIAGNOSIS — E1165 Type 2 diabetes mellitus with hyperglycemia: Secondary | ICD-10-CM

## 2021-06-18 ENCOUNTER — Telehealth: Payer: Self-pay | Admitting: Nurse Practitioner

## 2021-06-18 DIAGNOSIS — E1165 Type 2 diabetes mellitus with hyperglycemia: Secondary | ICD-10-CM

## 2021-06-18 NOTE — Telephone Encounter (Signed)
Pt is wanting a refill on Metformin. She does have an upcoming appointment on 06/29/21 with Dr. Gena Fray for this issue.  [facility] [address] [place], [place]. AT [place]  [address] [place] [address]  Phone:  [phone]  Fax:  [phone]

## 2021-06-20 ENCOUNTER — Telehealth: Payer: Self-pay | Admitting: Nurse Practitioner

## 2021-06-22 MED ORDER — METFORMIN HCL 500 MG PO TABS
ORAL_TABLET | ORAL | 0 refills | Status: DC
Start: 2021-06-22 — End: 2021-09-14

## 2021-06-22 NOTE — Telephone Encounter (Signed)
Rx filled, pt has appointment with Dr. Gena Fray on 06/29/21

## 2021-06-29 ENCOUNTER — Ambulatory Visit: Payer: BC Managed Care – PPO | Admitting: Family Medicine

## 2021-07-06 ENCOUNTER — Ambulatory Visit (INDEPENDENT_AMBULATORY_CARE_PROVIDER_SITE_OTHER): Payer: BC Managed Care – PPO | Admitting: Family Medicine

## 2021-07-06 ENCOUNTER — Other Ambulatory Visit: Payer: Self-pay

## 2021-07-06 VITALS — BP 144/86 | HR 92 | Temp 97.7°F | Ht 60.0 in | Wt 255.4 lb

## 2021-07-06 DIAGNOSIS — Z1159 Encounter for screening for other viral diseases: Secondary | ICD-10-CM | POA: Diagnosis not present

## 2021-07-06 DIAGNOSIS — Z1231 Encounter for screening mammogram for malignant neoplasm of breast: Secondary | ICD-10-CM | POA: Diagnosis not present

## 2021-07-06 DIAGNOSIS — Z23 Encounter for immunization: Secondary | ICD-10-CM

## 2021-07-06 DIAGNOSIS — I10 Essential (primary) hypertension: Secondary | ICD-10-CM | POA: Diagnosis not present

## 2021-07-06 DIAGNOSIS — Z6841 Body Mass Index (BMI) 40.0 and over, adult: Secondary | ICD-10-CM

## 2021-07-06 DIAGNOSIS — Z114 Encounter for screening for human immunodeficiency virus [HIV]: Secondary | ICD-10-CM

## 2021-07-06 DIAGNOSIS — E1165 Type 2 diabetes mellitus with hyperglycemia: Secondary | ICD-10-CM | POA: Diagnosis not present

## 2021-07-06 MED ORDER — LOSARTAN POTASSIUM-HCTZ 50-12.5 MG PO TABS: 1.0000 | ORAL_TABLET | Freq: Every day | ORAL | 3 refills | Status: DC

## 2021-07-06 NOTE — Addendum Note (Signed)
Addended by: Lynnea Ferrier on: 07/06/2021 04:12 PM   Modules accepted: Orders

## 2021-07-06 NOTE — Progress Notes (Signed)
[facility] [address] [place] [address] Dept: [phone] Dept Fax: [phone]  Chronic Care Office Visit  Subjective:    Patient ID: Jennifer Monroe, female    DOB: 1966/06/27, 55 y.o..   MRN: [id number]  Chief Complaint  Patient presents with   Follow-up    F/u DM/HTN.    No concerns.  Will get flu shot at pharmacy.     History of Present Illness:  Patient is in today for reassessment of chronic medical issues.  Jennifer Monroe has a history of Type 2 diabetes diagnosed since COVID hit. She was last seen in Jan., but was under the impression that she did not need to be seen for 9 months. She does not check her blood sugars at home, so is not sure how that is doing. She has not seen the eye doctor in more than a year. She had been trying to follow her diet well and exercise, but her twin sons are involved in ice hockey and are going to practice most days and traveling for competition. This has caused her to eat out more and exercise less.  Jennifer Monroe has a history fo hypertension. She is managed on losartan.  Past Medical History: Patient Active Problem List   Diagnosis Date Noted   Type 2 diabetes mellitus with hyperglycemia, without long-term current use of insulin ([place]) 12/22/2019   Essential hypertension 05/28/2018   Primary osteoarthritis of both hands 05/28/2018   Patellofemoral arthralgia of both knees 05/28/2018   DERMATITIS, CONTACT, NEC 04/07/2007   Past Surgical History:  Procedure Laterality Date   CESAREAN SECTION     HERNIA REPAIR     LEEP     55 years old-treated cervical cancer   Family History  Problem Relation Age of Onset   Hypertension Mother    Stroke Mother 48       embolic stroke   Kidney disease Father    Diabetes Father    Stroke Father    Asthma Sister    Colon polyps Sister    Stroke Maternal Aunt 58       embolic stroke   Cancer Paternal Aunt 55       breast    Outpatient Medications Prior to Visit  Medication Sig Dispense Refill   aspirin EC 81 MG tablet Take 1 tablet (81 mg total) by mouth daily.     blood glucose meter kit and supplies KIT Dispense based on patient and insurance preference. Use 3x/week (fasting). ICD E11.65 1 each 0   metFORMIN (GLUCOPHAGE) 500 MG tablet TAKE ONE TABLET BY MOUTH TWICE A DAY WITH A MEAL 180 tablet 0   losartan (COZAAR) 25 MG tablet Take 1 tablet (25 mg total) by mouth in the morning and at bedtime. 180 tablet 3   No facility-administered medications prior to visit.   No Known Allergies    Objective:   Today's Vitals   07/06/21 1459  BP: (!) 144/86  Pulse: 92  Temp: 97.7 F (36.5 C)  TempSrc: Temporal  SpO2: 98%  Weight: 255 lb 6.4 oz (115.8 kg)  Height: 5' (1.524 m)   Body mass index is 49.88 kg/m.   General: Well developed, well nourished. No acute distress. Feet: Skin intact. Pulses 2+. 5.07 monofilament testign normal. Psych: Alert and oriented. Normal mood and affect.  Health Maintenance Due  Topic Date Due   Pneumococcal Vaccine 44-29 Years old (1 - PCV) Never done   OPHTHALMOLOGY EXAM  Never done   Hepatitis C Screening  Never done   TETANUS/TDAP  Never done   PAP SMEAR-Modifier  Never done   COLONOSCOPY (Pts 45-70yr Insurance coverage will need to be confirmed)  Never done   Zoster Vaccines- Shingrix (1 of 2) Never done   COVID-19 Vaccine (3 - Booster for Pfizer series) 02/09/2020   MAMMOGRAM  07/09/2020   HEMOGLOBIN A1C  03/22/2021   INFLUENZA VACCINE  04/16/2021   Lab Results Lab Results  Component Value Date   HGBA1C 6.1 09/22/2020   Lab Results  Component Value Date   CHOL 165 12/08/2019   HDL 37.20 (L) 12/08/2019   LDLCALC 98 12/08/2019   TRIG 147.0 12/08/2019   CHOLHDL 4 12/08/2019   BMP Latest Ref Rng & Units 09/22/2020 12/22/2019 12/08/2019  Glucose 70 - 99 mg/dL 129(H) 140(H) 249(H)  BUN 6 - 23 mg/dL 14 10 14   Creatinine 0.40 - 1.20 mg/dL 0.65 0.73 0.68  Sodium  135 - 145 mEq/L 138 136 134(L)  Potassium 3.5 - 5.1 mEq/L 4.1 4.7 4.3  Chloride 96 - 112 mEq/L 101 103 99  CO2 19 - 32 mEq/L 29 27 28   Calcium 8.4 - 10.5 mg/dL 9.4 8.7 9.1     Assessment & Plan:   1. Type 2 diabetes mellitus with hyperglycemia, without long-term current use of insulin (HCC) I will check her A1c and micral today. I recommend closer follow-up (about every 3 months) for her diabetes to monitor for control. She notes her father has diabetes and is currenlty on dialysis. Closer follow-up could help avoid this for her.  - Lipid panel - Microalbumin / creatinine urine ratio - Basic metabolic panel - Hemoglobin A1c - Urinalysis, Routine w reflex microscopic  2. Essential hypertension Jennifer Monroe's blood pressure is above goal. I will switch her to Hyzaar, to see if we can get her under 130/80.  - losartan-hydrochlorothiazide (HYZAAR) 50-12.5 MG tablet; Take 1 tablet by mouth daily.  Dispense: 90 tablet; Refill: 3  3. Encounter for screening mammogram for malignant neoplasm of breast  - MM DIGITAL SCREENING BILATERAL; Future  4. Encounter for hepatitis C screening test for low risk patient  - HCV Ab w Reflex to Quant PCR  5. Screening for HIV (human immunodeficiency virus)  - HIV Antibody (routine testing w rflx)  SHaydee Salter MD

## 2021-09-14 ENCOUNTER — Other Ambulatory Visit: Payer: Self-pay | Admitting: Nurse Practitioner

## 2021-09-14 DIAGNOSIS — E1165 Type 2 diabetes mellitus with hyperglycemia: Secondary | ICD-10-CM

## 2021-09-18 ENCOUNTER — Other Ambulatory Visit: Payer: Self-pay | Admitting: Nurse Practitioner

## 2021-09-18 DIAGNOSIS — E1165 Type 2 diabetes mellitus with hyperglycemia: Secondary | ICD-10-CM

## 2021-09-18 MED ORDER — METFORMIN HCL 500 MG PO TABS: ORAL_TABLET | ORAL | 0 refills | Status: DC

## 2021-09-18 NOTE — Telephone Encounter (Signed)
duplicate

## 2021-10-12 ENCOUNTER — Other Ambulatory Visit: Payer: Self-pay

## 2021-10-12 ENCOUNTER — Other Ambulatory Visit (INDEPENDENT_AMBULATORY_CARE_PROVIDER_SITE_OTHER): Payer: BC Managed Care – PPO

## 2021-10-12 ENCOUNTER — Telehealth: Payer: Self-pay | Admitting: Nurse Practitioner

## 2021-10-12 ENCOUNTER — Ambulatory Visit: Payer: BC Managed Care – PPO | Admitting: Nurse Practitioner

## 2021-10-12 DIAGNOSIS — Z1159 Encounter for screening for other viral diseases: Secondary | ICD-10-CM

## 2021-10-12 DIAGNOSIS — E1165 Type 2 diabetes mellitus with hyperglycemia: Secondary | ICD-10-CM | POA: Diagnosis not present

## 2021-10-12 DIAGNOSIS — Z114 Encounter for screening for human immunodeficiency virus [HIV]: Secondary | ICD-10-CM

## 2021-10-12 LAB — URINALYSIS, ROUTINE W REFLEX MICROSCOPIC
Bilirubin Urine: NEGATIVE
Nitrite: NEGATIVE
Specific Gravity, Urine: >=1.03 — AB (ref 1.000–1.030)
Total Protein, Urine: NEGATIVE
Urine Glucose: NEGATIVE
Urobilinogen, UA: 0.2 (ref 0.0–1.0)
pH: 5.5 (ref 5.0–8.0)

## 2021-10-12 LAB — MICROALBUMIN / CREATININE URINE RATIO
Creatinine,U: 177.5 mg/dL
Microalb Creat Ratio: 1.7 mg/g (ref 0.0–30.0)
Microalb, Ur: 3.1 mg/dL — ABNORMAL HIGH (ref 0.0–1.9)

## 2021-10-12 LAB — LIPID PANEL
Cholesterol: 175 mg/dL (ref 0–200)
HDL: 36.2 mg/dL — ABNORMAL LOW (ref >39.00)
LDL Cholesterol: 112 mg/dL — ABNORMAL HIGH (ref 0–99)
NonHDL: 138.41
Total CHOL/HDL Ratio: 5
Triglycerides: 130 mg/dL (ref 0.0–149.0)
VLDL: 26 mg/dL (ref 0.0–40.0)

## 2021-10-12 LAB — BASIC METABOLIC PANEL
BUN: 20 mg/dL (ref 6–23)
CO2: 27 mEq/L (ref 19–32)
Calcium: 9.2 mg/dL (ref 8.4–10.5)
Chloride: 102 mEq/L (ref 96–112)
Creatinine, Ser: 0.69 mg/dL (ref 0.40–1.20)
GFR: 98.01 mL/min (ref >60.00)
Glucose, Bld: 145 mg/dL — ABNORMAL HIGH (ref 70–99)
Potassium: 3.8 mEq/L (ref 3.5–5.1)
Sodium: 138 mEq/L (ref 135–145)

## 2021-10-12 LAB — HEMOGLOBIN A1C: Hgb A1c MFr Bld: 6.5 % (ref 4.6–6.5)

## 2021-10-12 NOTE — Telephone Encounter (Signed)
Pt did not show for her appointment. 10/12/21

## 2021-10-15 LAB — HIV ANTIBODY (ROUTINE TESTING W REFLEX): HIV 1&2 Ab, 4th Generation: NONREACTIVE

## 2021-10-18 LAB — HCV INTERPRETATION

## 2021-10-18 LAB — HCV AB W REFLEX TO QUANT PCR: HCV Ab: <0.1 s/co ratio (ref 0.0–0.9)

## 2021-10-28 NOTE — Telephone Encounter (Signed)
1ST NO SHOW Fee waived Letter mailed

## 2021-12-12 ENCOUNTER — Other Ambulatory Visit: Payer: Self-pay | Admitting: Nurse Practitioner

## 2021-12-12 DIAGNOSIS — E1165 Type 2 diabetes mellitus with hyperglycemia: Secondary | ICD-10-CM

## 2021-12-13 NOTE — Telephone Encounter (Signed)
Chart supports Rx ?Last seen 06/2021 ?LVM for patient to schedule follow up appointment.  ? ?

## 2021-12-18 ENCOUNTER — Telehealth: Payer: Self-pay | Admitting: Nurse Practitioner

## 2021-12-18 ENCOUNTER — Ambulatory Visit (INDEPENDENT_AMBULATORY_CARE_PROVIDER_SITE_OTHER): Payer: BC Managed Care – PPO | Admitting: Family Medicine

## 2021-12-18 VITALS — BP 132/84 | HR 74 | Temp 98.0°F | Ht 60.0 in | Wt 254.8 lb

## 2021-12-18 DIAGNOSIS — Z6841 Body Mass Index (BMI) 40.0 and over, adult: Secondary | ICD-10-CM | POA: Diagnosis not present

## 2021-12-18 DIAGNOSIS — E1165 Type 2 diabetes mellitus with hyperglycemia: Secondary | ICD-10-CM | POA: Diagnosis not present

## 2021-12-18 DIAGNOSIS — I10 Essential (primary) hypertension: Secondary | ICD-10-CM

## 2021-12-18 MED ORDER — ATORVASTATIN CALCIUM 40 MG PO TABS: 40.0000 mg | ORAL_TABLET | Freq: Every day | ORAL | 3 refills | Status: DC

## 2021-12-18 NOTE — Telephone Encounter (Signed)
Pt scheduled  

## 2021-12-18 NOTE — Telephone Encounter (Signed)
Pt is requesting to transfer care from [facility] to Dr Gena Fray, she said shes seen him before and its just easier for her to get an appt with him. Is this okay? ?

## 2021-12-18 NOTE — Progress Notes (Signed)
?[facility] ?[place] ?[place] [address] ?Dept: [phone] ?Dept Fax: [phone] ? ?Chronic Care Office Visit ? ?Subjective:  ? ? Patient ID: Jennifer Monroe, female    DOB: [birth year], 56 y.o..   MRN: [id number] ? ?Chief Complaint  ?Patient presents with  ? Follow-up  ?  F/u meds. , no concerns.    ? ? ?History of Present Illness: ? ?Patient is in today for reassessment of chronic medical issues. ? ?Jennifer Monroe has a history of Type 2 diabetes diagnosed in 2021. She is managed on metformin. ?  ?Jennifer Monroe has a history of hypertension. She is managed on losartan-HCTZ. She notes an occasional feeling of heaviness on her chest that she wonders if might be related to her BP pill. It does not happen all the time when she takes her medicine. ? ?Jennifer Monroe notes ongoing challenges with her weight. She wonders about Ozempic, since it could lead to weight loss. ? ?Past Medical History: ?Patient Active Problem List  ? Diagnosis Date Noted  ? Morbid obesity with BMI of 45.0-49.9, adult ([place]) 07/06/2021  ? Type 2 diabetes mellitus with hyperglycemia, without long-term current use of insulin ([place]) 12/22/2019  ? Essential hypertension 05/28/2018  ? Primary osteoarthritis of both hands 05/28/2018  ? Patellofemoral arthralgia of both knees 05/28/2018  ? DERMATITIS, CONTACT, NEC 04/07/2007  ? ?Past Surgical History:  ?Procedure Laterality Date  ? CESAREAN SECTION    ? HERNIA REPAIR    ? LEEP    ? 56 years old-treated cervical cancer  ? ?Family History  ?Problem Relation Age of Onset  ? Hypertension Mother   ? Stroke Mother 44  ?     embolic stroke  ? Kidney disease Father   ? Diabetes Father   ? Stroke Father   ? Asthma Sister   ? Colon polyps Sister   ? Stroke Maternal Aunt 58  ?     embolic stroke  ? Cancer Paternal Aunt 43  ?     breast  ? ?Outpatient Medications Prior to Visit  ?Medication Sig Dispense Refill  ? aspirin EC 81 MG tablet Take 1 tablet (81  mg total) by mouth daily.    ? blood glucose meter kit and supplies KIT Dispense based on patient and insurance preference. Use 3x/week (fasting). ICD E11.65 1 each 0  ? losartan-hydrochlorothiazide (HYZAAR) 50-12.5 MG tablet Take 1 tablet by mouth daily. 90 tablet 3  ? metFORMIN (GLUCOPHAGE) 500 MG tablet TAKE ONE TABLET BY MOUTH TWICE A DAY WITH A MEAL 180 tablet 0  ? ?No facility-administered medications prior to visit.  ? ?No Known Allergies ?   ?Objective:  ? ?Today's Vitals  ? 12/18/21 3295  ?BP: 132/84  ?Pulse: 74  ?Temp: 98 ?F (36.7 ?C)  ?TempSrc: Temporal  ?SpO2: 98%  ?Weight: 254 lb 12.8 oz (115.6 kg)  ?Height: 5' (1.524 m)  ? ?Body mass index is 49.76 kg/m?.  ? ?General: Well developed, well nourished. No acute distress. ?Psych: Alert and oriented. Normal mood and affect. ? ?Health Maintenance Due  ?Topic Date Due  ? OPHTHALMOLOGY EXAM  Never done  ? PAP SMEAR-Modifier  Never done  ? COLONOSCOPY (Pts 45-83yr Insurance coverage will need to be confirmed)  Never done  ? Zoster Vaccines- Shingrix (1 of 2) Never done  ? MAMMOGRAM  07/09/2020  ? ?Lab Results ? ?  Latest Ref Rng & Units 10/12/2021  ?  8:27 AM 09/22/2020  ?  9:31  AM 12/22/2019  ?  8:55 AM  ?BMP  ?Glucose 70 - 99 mg/dL 145   129   140    ?BUN 6 - 23 mg/dL _0 ?Creatinine 0.40 - 1.20 mg/dL 0.69   0.65   0.73    ?Sodium 135 - 145 mEq/L 138   138   136    ?Potassium 3.5 - 5.1 mEq/L 3.8   4.1   4.7    ?Chloride 96 - 112 mEq/L 102   101   103    ?CO2 19 - 32 mEq/L _1 ?Calcium 8.4 - 10.5 mg/dL 9.2   9.4   8.7    ? ?Last lipids ?Lab Results  ?Component Value Date  ? CHOL 175 10/12/2021  ? HDL 36.20 (L) 10/12/2021  ? LDLCALC 112 (H) 10/12/2021  ? TRIG 130.0 10/12/2021  ? CHOLHDL 5 10/12/2021  ? ?Last hemoglobin A1c ?Lab Results  ?Component Value Date  ? HGBA1C 6.5 10/12/2021  ?   ?Assessment & Plan:  ? ?1. Type 2 diabetes mellitus with hyperglycemia, without long-term current use of insulin ([place]) ?Jennifer Monroe's last A1c was at goal.  It is a little early to reassess this, so will order a future A1c for her to have drawn in about a month. We did discuss recommendations for primary prevention of ASCVD. Guidelines recommend she be on a moderate-intensity statin dose, regardless of her lipid levels. We will start this today. ? ?- Glucose, random; Future ?- Hemoglobin A1c; Future ?- atorvastatin (LIPITOR) 40 MG tablet; Take 1 tablet (40 mg total) by mouth daily.  Dispense: 90 tablet; Refill: 3 ? ?2. Essential hypertension ?Blood pressure is improved on Hyzaar. We will continue the 50-12.5 mg dose daily. ? ?3. Morbid obesity with BMI of 45.0-49.9, adult ([place]) ?Weight has been stable over the past 5 months. We discussed semaglutide as an option, but the cost would be prohibitive for her. Recommend continued focus on physical activity and diet. ? ?Return in about 3 months (around 03/19/2022) for Transfer of Care.  ? ?Haydee Salter, MD ?

## 2022-01-15 ENCOUNTER — Other Ambulatory Visit: Payer: BC Managed Care – PPO

## 2022-01-29 ENCOUNTER — Other Ambulatory Visit (INDEPENDENT_AMBULATORY_CARE_PROVIDER_SITE_OTHER): Payer: BC Managed Care – PPO

## 2022-01-29 DIAGNOSIS — E1165 Type 2 diabetes mellitus with hyperglycemia: Secondary | ICD-10-CM | POA: Diagnosis not present

## 2022-01-29 LAB — GLUCOSE, RANDOM: Glucose, Bld: 165 mg/dL — ABNORMAL HIGH (ref 70–99)

## 2022-01-29 LAB — HEMOGLOBIN A1C: Hgb A1c MFr Bld: 6.5 % (ref 4.6–6.5)

## 2022-03-11 ENCOUNTER — Other Ambulatory Visit: Payer: Self-pay | Admitting: Nurse Practitioner

## 2022-03-11 DIAGNOSIS — E1165 Type 2 diabetes mellitus with hyperglycemia: Secondary | ICD-10-CM

## 2022-03-11 MED ORDER — METFORMIN HCL 500 MG PO TABS: ORAL_TABLET | ORAL | 0 refills | Status: DC

## 2022-03-15 ENCOUNTER — Telehealth: Payer: Self-pay

## 2022-03-15 NOTE — Telephone Encounter (Signed)
Left patient a detailed voice message to return call to office for assistance with scheduling mammogram.

## 2022-03-22 ENCOUNTER — Encounter: Payer: BC Managed Care – PPO | Admitting: Family Medicine

## 2022-03-22 ENCOUNTER — Encounter: Payer: BC Managed Care – PPO | Admitting: Nurse Practitioner

## 2022-05-10 NOTE — Telephone Encounter (Signed)
Na

## 2022-06-16 ENCOUNTER — Other Ambulatory Visit: Payer: Self-pay | Admitting: Nurse Practitioner

## 2022-06-16 ENCOUNTER — Other Ambulatory Visit: Payer: Self-pay | Admitting: Family Medicine

## 2022-06-16 DIAGNOSIS — I10 Essential (primary) hypertension: Secondary | ICD-10-CM

## 2022-06-16 DIAGNOSIS — E1165 Type 2 diabetes mellitus with hyperglycemia: Secondary | ICD-10-CM

## 2022-09-20 ENCOUNTER — Encounter: Payer: Self-pay | Admitting: Family Medicine

## 2022-09-20 ENCOUNTER — Ambulatory Visit (INDEPENDENT_AMBULATORY_CARE_PROVIDER_SITE_OTHER): Payer: BC Managed Care – PPO | Admitting: Family Medicine

## 2022-09-20 VITALS — BP 132/84 | HR 86 | Temp 97.2°F | Ht 60.0 in | Wt 258.2 lb

## 2022-09-20 DIAGNOSIS — Z6841 Body Mass Index (BMI) 40.0 and over, adult: Secondary | ICD-10-CM

## 2022-09-20 DIAGNOSIS — Z1211 Encounter for screening for malignant neoplasm of colon: Secondary | ICD-10-CM

## 2022-09-20 DIAGNOSIS — E1165 Type 2 diabetes mellitus with hyperglycemia: Secondary | ICD-10-CM | POA: Diagnosis not present

## 2022-09-20 DIAGNOSIS — I10 Essential (primary) hypertension: Secondary | ICD-10-CM

## 2022-09-20 DIAGNOSIS — Z1231 Encounter for screening mammogram for malignant neoplasm of breast: Secondary | ICD-10-CM | POA: Diagnosis not present

## 2022-09-20 LAB — HEMOGLOBIN A1C: Hgb A1c MFr Bld: 7.3 % — ABNORMAL HIGH (ref 4.6–6.5)

## 2022-09-20 LAB — URINALYSIS, ROUTINE W REFLEX MICROSCOPIC
Bilirubin Urine: NEGATIVE
Hgb urine dipstick: NEGATIVE
Ketones, ur: NEGATIVE
Leukocytes,Ua: NEGATIVE
Nitrite: NEGATIVE
RBC / HPF: NONE SEEN (ref 0–?)
Specific Gravity, Urine: >=1.03 — AB (ref 1.000–1.030)
Total Protein, Urine: NEGATIVE
Urine Glucose: NEGATIVE
Urobilinogen, UA: 0.2 (ref 0.0–1.0)
pH: 5.5 (ref 5.0–8.0)

## 2022-09-20 LAB — BASIC METABOLIC PANEL
BUN: 17 mg/dL (ref 6–23)
CO2: 28 mEq/L (ref 19–32)
Calcium: 9.3 mg/dL (ref 8.4–10.5)
Chloride: 100 mEq/L (ref 96–112)
Creatinine, Ser: 0.67 mg/dL (ref 0.40–1.20)
GFR: 98.06 mL/min (ref >60.00)
Glucose, Bld: 168 mg/dL — ABNORMAL HIGH (ref 70–99)
Potassium: 4 mEq/L (ref 3.5–5.1)
Sodium: 137 mEq/L (ref 135–145)

## 2022-09-20 LAB — LIPID PANEL
Cholesterol: 133 mg/dL (ref 0–200)
HDL: 39.8 mg/dL (ref >39.00)
LDL Cholesterol: 61 mg/dL (ref 0–99)
NonHDL: 93.36
Total CHOL/HDL Ratio: 3
Triglycerides: 162 mg/dL — ABNORMAL HIGH (ref 0.0–149.0)
VLDL: 32.4 mg/dL (ref 0.0–40.0)

## 2022-09-20 LAB — MICROALBUMIN / CREATININE URINE RATIO
Creatinine,U: 89.6 mg/dL
Microalb Creat Ratio: 0.8 mg/g (ref 0.0–30.0)
Microalb, Ur: <0.7 mg/dL (ref 0.0–1.9)

## 2022-09-20 MED ORDER — TIRZEPATIDE 2.5 MG/0.5ML ~~LOC~~ SOAJ: 2.5000 mg | SUBCUTANEOUS | 0 refills | Status: DC

## 2022-09-20 MED ORDER — TIRZEPATIDE 5 MG/0.5ML ~~LOC~~ SOAJ: 5.0000 mg | SUBCUTANEOUS | 3 refills | Status: DC

## 2022-09-20 MED ORDER — METFORMIN HCL 500 MG PO TABS: 500.0000 mg | ORAL_TABLET | Freq: Two times a day (BID) | ORAL | 3 refills | Status: DC

## 2022-09-20 NOTE — Addendum Note (Signed)
Addended by: Haydee Salter on: 09/20/2022 03:25 PM   Modules accepted: Orders

## 2022-09-20 NOTE — Progress Notes (Signed)
[facility] [address] [place] [address] Dept: [phone] Dept Fax: [phone]  Chronic Care Office Visit  Subjective:    Patient ID: Jennifer Monroe, female    DOB: 11/16/1965, 57 y.o..   MRN: [id number]  Chief Complaint  Patient presents with   Follow-up    F/u meds.  No concerns.  Fasting today.     History of Present Illness:  Patient is in today for reassessment of chronic medical issues.  Ms. Jefcoat has a history of Type 2 diabetes diagnosed in 2021. She is managed on metformin 500 mg bid.   Ms. Maret has a history of hypertension. She is managed on losartan-HCTZ 50-12.5 mg daily.    Ms. Rahl notes ongoing challenges with her weight. She asks about Ozempic as an alternative to her metformin, since it could lead to weight loss.  Past Medical History: Patient Active Problem List   Diagnosis Date Noted   Morbid obesity with BMI of 50.0-59.9, adult ([place]) 07/06/2021   Type 2 diabetes mellitus with hyperglycemia, without long-term current use of insulin ([place]) 12/22/2019   Essential hypertension 05/28/2018   Primary osteoarthritis of both hands 05/28/2018   Patellofemoral arthralgia of both knees 05/28/2018   Past Surgical History:  Procedure Laterality Date   CESAREAN SECTION     HERNIA REPAIR     LEEP     57 years old-treated cervical cancer   Family History  Problem Relation Age of Onset   Hypertension Mother    Stroke Mother 66       embolic stroke   Kidney disease Father    Diabetes Father    Stroke Father    Asthma Sister    Colon polyps Sister    Stroke Maternal Aunt 58       embolic stroke   Cancer Paternal Aunt 31       breast   Outpatient Medications Prior to Visit  Medication Sig Dispense Refill   aspirin EC 81 MG tablet Take 1 tablet (81 mg total) by mouth daily.     atorvastatin (LIPITOR) 40 MG tablet Take 1 tablet (40 mg total) by mouth daily. 90 tablet 3   blood  glucose meter kit and supplies KIT Dispense based on patient and insurance preference. Use 3x/week (fasting). ICD E11.65 1 each 0   losartan-hydrochlorothiazide (HYZAAR) 50-12.5 MG tablet Take 1 tablet by mouth daily. Need appt for additional refills 90 tablet 1   metFORMIN (GLUCOPHAGE) 500 MG tablet TAKE ONE TABLET BY MOUTH TWICE A DAY WITH A MEAL 180 tablet 0   No facility-administered medications prior to visit.   No Known Allergies    Objective:   Today's Vitals   09/20/22 0848  BP: 132/84  Pulse: 86  Temp: (!) 97.2 F (36.2 C)  TempSrc: Temporal  SpO2: 97%  Weight: 258 lb 3.2 oz (117.1 kg)  Height: 5' (1.524 m)   Body mass index is 50.43 kg/m.   General: Well developed, well nourished. No acute distress. Feet- Skin intact. No sign of maceration between toes. Nails are normal. Dorsalis pedis and posterior   tibial artery pulses are normal. 5.07 monofilament testing normal. Psych: Alert and oriented. Normal mood and affect.  Health Maintenance Due  Topic Date Due   OPHTHALMOLOGY EXAM  Never done   PAP SMEAR-Modifier  Never done   COLONOSCOPY (Pts 45-72yr Insurance coverage will need to be confirmed)  Never done   Zoster Vaccines- Shingrix (1 of 2) Never  done   MAMMOGRAM  07/09/2020   HEMOGLOBIN A1C  08/01/2022   Diabetic kidney evaluation - eGFR measurement  10/12/2022   Diabetic kidney evaluation - Urine ACR  10/12/2022     Assessment & Plan:   1. Essential hypertension Ms. Stegenga's blood pressure is acceptable today. I will continue her on Hyzaar 50-12.5 mg daily.  2. Type 2 diabetes mellitus with hyperglycemia, without long-term current use of insulin (HCC) We will check her annual DM labs today. I will continue her on metformin 500 mg bid. Foot exam performed today. I strongly recommend follow-up for annual eye exam.  - Lipid panel - Microalbumin / creatinine urine ratio - Basic metabolic panel - Hemoglobin A1c - Urinalysis, Routine w reflex  microscopic - metFORMIN (GLUCOPHAGE) 500 MG tablet; Take 1 tablet (500 mg total) by mouth 2 (two) times daily with a meal.  Dispense: 180 tablet; Refill: 3  3. Morbid obesity with BMI of 50.0-59.9, adult (HCC) Weight is up about 4 lbs over the past year. I discussed medication options for managing obesity. We discussed insurance coverage for such. I recommend she check with her insurer to find out if this would be covered for her, since her diabetes has been in good control with metformin. She should continue efforts at an improved diet and regular exercise as a foundation to weight loss and a healthy lifestyle.  4. Encounter for screening mammogram for malignant neoplasm of breast  - MM DIGITAL SCREENING BILATERAL; Future  5. Screening for colon cancer  - Ambulatory referral to Gastroenterology   Return in about 3 months (around 12/20/2022) for Reassessment.   Haydee Salter, MD

## 2022-09-23 ENCOUNTER — Telehealth: Payer: Self-pay | Admitting: Family Medicine

## 2022-09-23 NOTE — Telephone Encounter (Signed)
Pt called and stated that the pharmacy is still waiting for your approval for tirzepatide  medication . Pt went to the pharmacy today to try to pick up

## 2022-09-23 NOTE — Telephone Encounter (Signed)
Lft detailed VM that PA's take anywhere form 24 - 72 hours after being sent to your insurance.   Did we get a PA on this medication?  Please do one if you didn't get.  Thanks. Dm/cma

## 2022-09-24 ENCOUNTER — Other Ambulatory Visit (HOSPITAL_COMMUNITY): Payer: Self-pay

## 2022-09-24 NOTE — Telephone Encounter (Signed)
Patient Advocate Encounter  Prior authorization is required for [facility] 2.'5MG'$ /0.5ML pen-injectors. PA submitted and APPROVED on 09/24/22.  Key [id number] Effective: 09/24/22 - 09/24/23

## 2022-09-24 NOTE — Telephone Encounter (Signed)
Talked to pharmacy, the 2.5 mg will be $25. While the 5 mg dose is going to be $200.  Was the PA done for both doses?  Dm/cma

## 2022-09-26 ENCOUNTER — Telehealth: Payer: Self-pay | Admitting: Family Medicine

## 2022-09-26 NOTE — Telephone Encounter (Signed)
Pt want to know if she should still take her metformin with the new medication that she just started

## 2022-09-26 NOTE — Telephone Encounter (Signed)
Spoke to patient and advised that she is to take the Metformin with the [facility]. No further questions. Dm/cma

## 2022-09-27 NOTE — Telephone Encounter (Signed)
error 

## 2022-11-11 ENCOUNTER — Ambulatory Visit
Admission: RE | Admit: 2022-11-11 | Discharge: 2022-11-11 | Disposition: A | Discharge status: Home / Self Care | Payer: BC Managed Care – PPO | Source: Ambulatory Visit | Attending: Family Medicine | Admitting: Family Medicine

## 2022-11-11 DIAGNOSIS — Z1231 Encounter for screening mammogram for malignant neoplasm of breast: Secondary | ICD-10-CM

## 2022-12-16 ENCOUNTER — Other Ambulatory Visit: Payer: Self-pay | Admitting: Nurse Practitioner

## 2022-12-16 DIAGNOSIS — I10 Essential (primary) hypertension: Secondary | ICD-10-CM

## 2022-12-20 ENCOUNTER — Telehealth: Payer: Self-pay | Admitting: Family Medicine

## 2022-12-20 NOTE — Telephone Encounter (Signed)
Spoke to patient and advised that appointment was needed for this.  She scheduled for 12/27/22 although wasn't happy about it, stating she would find a new provider after this.   Dm/cma

## 2022-12-20 NOTE — Telephone Encounter (Signed)
Spoke to patient and advised that appointment was needed for this.  She scheduled for 12/27/22 although wasn't happy about it, stating she would find a new provider after this.   Dm/cma 

## 2022-12-20 NOTE — Telephone Encounter (Signed)
Caller Name: Jennifer Monroe Call back phone #: [phone]  Reason for Call: Pt has been told that her [facility] is out of stock and they will not be getting any more. She will call around to other pharmacies. Otherwise do you have an alternative? She is out

## 2022-12-20 NOTE — Telephone Encounter (Signed)
Lft VM to rtn call. Dm/cma  

## 2022-12-20 NOTE — Telephone Encounter (Signed)
Lft VM to rtn call.   Needs to schedule a f/u appointment.  Dm/cma

## 2022-12-20 NOTE — Telephone Encounter (Signed)
Caller Name: Riyanshi Call back phone #: [phone]   MEDICATION(S):  Losartin  Days of Med Remaining: 7  Has the patient contacted their pharmacy (YES/NO)? yes What did pharmacy advise?   Preferred Pharmacy:  [facility]

## 2022-12-27 ENCOUNTER — Ambulatory Visit (INDEPENDENT_AMBULATORY_CARE_PROVIDER_SITE_OTHER): Payer: BC Managed Care – PPO | Admitting: Family Medicine

## 2022-12-27 ENCOUNTER — Encounter: Payer: Self-pay | Admitting: Family Medicine

## 2022-12-27 ENCOUNTER — Ambulatory Visit: Payer: BC Managed Care – PPO | Admitting: Family Medicine

## 2022-12-27 VITALS — BP 130/74 | HR 90 | Temp 98.6°F | Ht 60.0 in | Wt 244.0 lb

## 2022-12-27 DIAGNOSIS — E1165 Type 2 diabetes mellitus with hyperglycemia: Secondary | ICD-10-CM | POA: Diagnosis not present

## 2022-12-27 DIAGNOSIS — Z1211 Encounter for screening for malignant neoplasm of colon: Secondary | ICD-10-CM | POA: Diagnosis not present

## 2022-12-27 DIAGNOSIS — Z6841 Body Mass Index (BMI) 40.0 and over, adult: Secondary | ICD-10-CM

## 2022-12-27 DIAGNOSIS — I10 Essential (primary) hypertension: Secondary | ICD-10-CM

## 2022-12-27 MED ORDER — LOSARTAN POTASSIUM-HCTZ 50-12.5 MG PO TABS: 1.0000 | ORAL_TABLET | Freq: Every day | ORAL | 3 refills | Status: DC

## 2022-12-27 MED ORDER — TIRZEPATIDE 2.5 MG/0.5ML ~~LOC~~ SOAJ
2.5000 mg | SUBCUTANEOUS | 0 refills | Status: DC
Start: 2022-12-27 — End: 2023-03-10

## 2022-12-27 NOTE — Assessment & Plan Note (Signed)
Maximum weight: 271 lbs (11/2019) Current weight: 244 lbs Weight change since last visit: - 14 lbs Total weight loss: -27 lbs (10%)  Weight loss was going well on Mounjaro. Ms. Veasy will continue to try and obtain this at an affordable cost.

## 2022-12-27 NOTE — Progress Notes (Signed)
[facility] PRIMARY CARE LB PRIMARY [facility]recia [facility] [address] [place] [address] Dept: [phone] Dept Fax: [phone]  Chronic Care Office Visit  Subjective:    Patient ID: Jennifer Monroe, female    DOB: 18-Dec-1965, 57 y.o..   MRN: [id number]  Chief Complaint  Patient presents with   Medical Management of Chronic Issues    3 month f/u. No concerns.     History of Present Illness:  Patient is in today for reassessment of chronic medical issues.  Ms. Monroe has a history of Type 2 diabetes diagnosed in 2021. She is managed on metformin 500 mg bid. After her last visit, we also started [facility]. She notes she did well on the 2.5 mg weekly dose. Her 2nd month, the co-pay was still affordable, as she moved up to the 5 mg dose. However, this last month, her co-pay was going to be more than she could afford. She notes there was some issue with the coupon that would have helped her out. She is still checking in periodically with her pharmacy concerning this.   [name] has a history of hypertension. She is managed on losartan-HCTZ 50-12.5 mg daily.   Past Medical History: Patient Active Problem List   Diagnosis Date Noted   Morbid obesity with BMI of 50.0-59.9, adult 07/06/2021   Type 2 diabetes mellitus with hyperglycemia, without long-term current use of insulin 12/22/2019   Essential hypertension 05/28/2018   Primary osteoarthritis of both hands 05/28/2018   Patellofemoral arthralgia of both knees 05/28/2018   Past Surgical History:  Procedure Laterality Date   CESAREAN SECTION     HERNIA REPAIR     LEEP     57 years old-treated cervical cancer   Family History  Problem Relation Age of Onset   Hypertension Mother    Stroke Mother 56       embolic stroke   Heart disease Father    Kidney disease Father    Diabetes Father    Stroke Father    Asthma Sister    Colon polyps Sister    Stroke Maternal Aunt 26       embolic stroke   Cancer  Paternal Aunt 92       breast   Breast cancer Neg Hx    Outpatient Medications Prior to Visit  Medication Sig Dispense Refill   aspirin EC 81 MG tablet Take 1 tablet (81 mg total) by mouth daily.     atorvastatin (LIPITOR) 40 MG tablet Take 1 tablet (40 mg total) by mouth daily. 90 tablet 3   blood glucose meter kit and supplies KIT Dispense based on patient and insurance preference. Use 3x/week (fasting). ICD E11.65 1 each 0   metFORMIN (GLUCOPHAGE) 500 MG tablet Take 1 tablet (500 mg total) by mouth 2 (two) times daily with a meal. 180 tablet 3   losartan-hydrochlorothiazide (HYZAAR) 50-12.5 MG tablet Take 1 tablet by mouth daily. Need appt for additional refills 90 tablet 1   tirzepatide (MOUNJARO) 2.5 MG/0.5ML Pen Inject 2.5 mg into the skin once a week. 2 mL 0   tirzepatide (MOUNJARO) 5 MG/0.5ML Pen Inject 5 mg into the skin once a week. 6 mL 3   No facility-administered medications prior to visit.   No Known Allergies Objective:   Today's Vitals   12/27/22 1300  BP: 130/74  Pulse: 90  Temp: 98.6 F (37 C)  TempSrc: Temporal  SpO2: 98%  Weight: 244 lb (110.7 kg)  Height: 5' (1.524 m)  Body mass index is 47.65 kg/m.   General: Well developed, well nourished. No acute distress. Psych: Alert and oriented. Normal mood and affect.  Health Maintenance Due  Topic Date Due   OPHTHALMOLOGY EXAM  Never done   PAP SMEAR-Modifier  Never done   COLONOSCOPY (Pts 45-6yrs Insurance coverage will need to be confirmed)  Never done   Zoster Vaccines- Shingrix (1 of 2) Never done     Assessment & Plan:   Problem List Items Addressed This Visit       Cardiovascular and Mediastinum   Essential hypertension - Primary    Blood pressure is in adequate control. Continue Hyzaar 50-12.5 mg daily.      Relevant Medications   losartan-hydrochlorothiazide (HYZAAR) 50-12.5 MG tablet     Endocrine   Type 2 diabetes mellitus with hyperglycemia, without long-term current use of insulin     Last A1c was above goal. She was on Mounjaro for 2 months. I will reassess her A1c today. Hopefully, we can continue with Mounjaro.      Relevant Medications   tirzepatide (MOUNJARO) 2.5 MG/0.5ML Pen   losartan-hydrochlorothiazide (HYZAAR) 50-12.5 MG tablet   Other Relevant Orders   Glucose, random   Hemoglobin A1c     Other   Morbid obesity with BMI of 50.0-59.9, adult    Maximum weight: 271 lbs (11/2019) Current weight: 244 lbs Weight change since last visit: - 14 lbs Total weight loss: -27 lbs (10%)  Weight loss was going well on Mounjaro. [name] will continue to try and obtain this at an affordable cost.      Relevant Medications   tirzepatide [facility]) 2.5 MG/0.5ML Pen   Other Visit Diagnoses     Screening for colon cancer       Relevant Orders   Cologuard       Return in about 3 months (around 03/28/2023) for Reassessment and pap smear.   Loyola Mast, MD

## 2022-12-27 NOTE — Assessment & Plan Note (Signed)
Blood pressure is in adequate control. Continue Hyzaar 50-12.5 mg daily.

## 2022-12-27 NOTE — Assessment & Plan Note (Signed)
Last A1c was above goal. She was on [facility] for 2 months. I will reassess her A1c today. Hopefully, we can continue with Mounjaro.

## 2022-12-28 LAB — HEMOGLOBIN A1C

## 2022-12-30 LAB — GLUCOSE, RANDOM: Glucose: 185 mg/dL — ABNORMAL HIGH (ref 70–99)

## 2023-02-11 ENCOUNTER — Encounter: Payer: Self-pay | Admitting: Family Medicine

## 2023-02-11 LAB — COLOGUARD: COLOGUARD: NEGATIVE

## 2023-03-03 ENCOUNTER — Encounter: Payer: BC Managed Care – PPO | Admitting: Family Medicine

## 2023-03-03 DIAGNOSIS — E1165 Type 2 diabetes mellitus with hyperglycemia: Secondary | ICD-10-CM

## 2023-03-03 LAB — HEMOGLOBIN A1C: Hgb A1c MFr Bld: 6 % (ref 4.6–6.5)

## 2023-03-03 NOTE — Progress Notes (Signed)
This encounter was created in error - Lab only visit

## 2023-03-10 ENCOUNTER — Other Ambulatory Visit: Payer: Self-pay

## 2023-03-10 ENCOUNTER — Telehealth: Payer: Self-pay | Admitting: Family Medicine

## 2023-03-10 DIAGNOSIS — E1165 Type 2 diabetes mellitus with hyperglycemia: Secondary | ICD-10-CM

## 2023-03-10 MED ORDER — TIRZEPATIDE 2.5 MG/0.5ML ~~LOC~~ SOAJ
2.5000 mg | SUBCUTANEOUS | 0 refills | Status: DC
Start: 2023-03-10 — End: 2023-10-14

## 2023-03-10 NOTE — Telephone Encounter (Signed)
RX was already sent to the pharmacy. Patient notified VIA phone.  diabetes mellitus

## 2023-03-10 NOTE — Telephone Encounter (Signed)
Received a request for mail order for the [facility].  Rx sent to the pharmacy. Dm/cma

## 2023-03-10 NOTE — Telephone Encounter (Signed)
Pt needs her tirzepatide [facility]) 2.5 MG/0.5ML Pen [[id number]] refilled please send to [facility]. She has waited too late and needs it asap.

## 2023-09-22 ENCOUNTER — Other Ambulatory Visit (HOSPITAL_COMMUNITY): Payer: Self-pay

## 2023-10-08 ENCOUNTER — Ambulatory Visit: Payer: BC Managed Care – PPO | Admitting: Family Medicine

## 2023-10-09 ENCOUNTER — Telehealth: Payer: Self-pay

## 2023-10-09 DIAGNOSIS — E1165 Type 2 diabetes mellitus with hyperglycemia: Secondary | ICD-10-CM

## 2023-10-09 NOTE — Telephone Encounter (Signed)
Received a refill request for  Mounjaro 5 mg/5 ml pen  LR  ? LOV  12/27/22 FOV 10/14/23  Please reveiw and advise.  Thanks Dm/cma

## 2023-10-10 NOTE — Telephone Encounter (Signed)
Noted. Dm/cma

## 2023-10-14 ENCOUNTER — Encounter: Payer: Self-pay | Admitting: Family Medicine

## 2023-10-14 ENCOUNTER — Ambulatory Visit (INDEPENDENT_AMBULATORY_CARE_PROVIDER_SITE_OTHER): Payer: BC Managed Care – PPO | Admitting: Family Medicine

## 2023-10-14 VITALS — BP 126/80 | HR 92 | Temp 98.2°F | Ht 60.0 in | Wt 225.8 lb

## 2023-10-14 DIAGNOSIS — I10 Essential (primary) hypertension: Secondary | ICD-10-CM | POA: Diagnosis not present

## 2023-10-14 DIAGNOSIS — B351 Tinea unguium: Secondary | ICD-10-CM

## 2023-10-14 DIAGNOSIS — Z6841 Body Mass Index (BMI) 40.0 and over, adult: Secondary | ICD-10-CM

## 2023-10-14 DIAGNOSIS — Z7984 Long term (current) use of oral hypoglycemic drugs: Secondary | ICD-10-CM

## 2023-10-14 DIAGNOSIS — Z1231 Encounter for screening mammogram for malignant neoplasm of breast: Secondary | ICD-10-CM

## 2023-10-14 DIAGNOSIS — E1165 Type 2 diabetes mellitus with hyperglycemia: Secondary | ICD-10-CM

## 2023-10-14 DIAGNOSIS — Z7985 Long-term (current) use of injectable non-insulin antidiabetic drugs: Secondary | ICD-10-CM | POA: Diagnosis not present

## 2023-10-14 LAB — URINALYSIS, ROUTINE W REFLEX MICROSCOPIC
Bilirubin Urine: NEGATIVE
Hgb urine dipstick: NEGATIVE
Ketones, ur: NEGATIVE
Nitrite: NEGATIVE
Specific Gravity, Urine: >=1.03 — AB (ref 1.000–1.030)
Urine Glucose: NEGATIVE
Urobilinogen, UA: 0.2 (ref 0.0–1.0)
pH: 6 (ref 5.0–8.0)

## 2023-10-14 LAB — BASIC METABOLIC PANEL WITH GFR
BUN: 17 mg/dL (ref 6–23)
CO2: 30 meq/L (ref 19–32)
Calcium: 9.4 mg/dL (ref 8.4–10.5)
Chloride: 103 meq/L (ref 96–112)
Creatinine, Ser: 0.66 mg/dL (ref 0.40–1.20)
GFR: 97.68 mL/min
Glucose, Bld: 101 mg/dL — ABNORMAL HIGH (ref 70–99)
Potassium: 3.7 meq/L (ref 3.5–5.1)
Sodium: 141 meq/L (ref 135–145)

## 2023-10-14 LAB — MICROALBUMIN / CREATININE URINE RATIO
Creatinine,U: 143.3 mg/dL
Microalb Creat Ratio: 2.6 mg/g (ref 0.0–30.0)
Microalb, Ur: 3.7 mg/dL — ABNORMAL HIGH (ref 0.0–1.9)

## 2023-10-14 LAB — LIPID PANEL
Cholesterol: 202 mg/dL — ABNORMAL HIGH (ref 0–200)
HDL: 44.4 mg/dL
LDL Cholesterol: 120 mg/dL — ABNORMAL HIGH (ref 0–99)
NonHDL: 157.57
Total CHOL/HDL Ratio: 5
Triglycerides: 190 mg/dL — ABNORMAL HIGH (ref 0.0–149.0)
VLDL: 38 mg/dL (ref 0.0–40.0)

## 2023-10-14 LAB — HEMOGLOBIN A1C: Hgb A1c MFr Bld: 6 % (ref 4.6–6.5)

## 2023-10-14 MED ORDER — ATORVASTATIN CALCIUM 40 MG PO TABS: 40.0000 mg | ORAL_TABLET | Freq: Every day | ORAL | 3 refills | Status: DC

## 2023-10-14 MED ORDER — LOSARTAN POTASSIUM-HCTZ 50-12.5 MG PO TABS: 1.0000 | ORAL_TABLET | Freq: Every day | ORAL | 3 refills | Status: AC

## 2023-10-14 MED ORDER — TIRZEPATIDE 5 MG/0.5ML ~~LOC~~ SOAJ: 5.0000 mg | SUBCUTANEOUS | 11 refills | Status: DC

## 2023-10-14 MED ORDER — METFORMIN HCL 500 MG PO TABS: 500.0000 mg | ORAL_TABLET | Freq: Two times a day (BID) | ORAL | 3 refills | Status: DC

## 2023-10-14 NOTE — Assessment & Plan Note (Signed)
Last A1c was in excellent control. Continue metformin 500 mg bid and tirzepatide (Mounjaro) 5 mg weekly. I will check annual DM labs. I advised her to see her eye doctor for an annual retinopathy screen.

## 2023-10-14 NOTE — Assessment & Plan Note (Signed)
Maximum weight: 271 lbs (11/2019) Current weight: 225 lbs Weight change since last visit: - 19 lbs Total weight loss: -46 lbs (17%)  Ongoing weight loss. Continue use of tirzepatide (Mounjaro) 5 mg weekly.

## 2023-10-14 NOTE — Assessment & Plan Note (Signed)
Blood pressure is in good control. Continue lisinopril-HCTZ (Hyzaar) 50-12.5 mg daily.

## 2023-10-14 NOTE — Progress Notes (Signed)
[facility] PRIMARY CARE LB PRIMARY [facility]ia Rogers [facility] [address] [place] [address] Dept: [phone] Dept Fax: [phone]  Chronic Care Office Visit  Subjective:    Patient ID: Jennifer Monroe, female    DOB: 1966/03/19, 58 y.o..   MRN: [id number]  Chief Complaint  Patient presents with   Hypertension    F/u meds.   Needs mammogram for April.   History of Present Illness:  Patient is in today for reassessment of chronic medical issues.  Ms. Ohalloran has a history of Type 2 diabetes diagnosed in 2021. She is managed on metformin 500 mg bid and tirzepatide (Mounjaro) 5 mg weekly. She has had some intermittent issues with not being able to get her medicine consistently. She is also on atorvastatin 40 mg daily for CV prevention related to her diabetes and dyslipidemia.   Ms. Desantiago has a history of hypertension. She is managed on losartan-HCTZ 50-12.5 mg daily.   Past Medical History: Patient Active Problem List   Diagnosis Date Noted   Morbid obesity with BMI of 50.0-59.9, adult (HCC) 07/06/2021   Type 2 diabetes mellitus with hyperglycemia, without long-term current use of insulin (HCC) 12/22/2019   Essential hypertension 05/28/2018   Primary osteoarthritis of both hands 05/28/2018   Patellofemoral arthralgia of both knees 05/28/2018   Past Surgical History:  Procedure Laterality Date   CESAREAN SECTION     HERNIA REPAIR     LEEP     58 years old-treated cervical cancer   Family History  Problem Relation Age of Onset   Hypertension Mother    Stroke Mother 56       embolic stroke   Heart disease Father    Kidney disease Father    Diabetes Father    Stroke Father    Asthma Sister    Colon polyps Sister    Stroke Maternal Aunt 84       embolic stroke   Cancer Paternal Aunt 3       breast   Breast cancer Neg Hx    Outpatient Medications Prior to Visit  Medication Sig Dispense Refill   blood glucose meter kit and supplies KIT Dispense  based on patient and insurance preference. Use 3x/week (fasting). ICD E11.65 1 each 0   atorvastatin (LIPITOR) 40 MG tablet Take 1 tablet (40 mg total) by mouth daily. 90 tablet 3   losartan-hydrochlorothiazide (HYZAAR) 50-12.5 MG tablet Take 1 tablet by mouth daily. Need appt for additional refills 90 tablet 3   metFORMIN (GLUCOPHAGE) 500 MG tablet Take 1 tablet (500 mg total) by mouth 2 (two) times daily with a meal. 180 tablet 3   tirzepatide (MOUNJARO) 2.5 MG/0.5ML Pen Inject 2.5 mg into the skin once a week. 2 mL 0   aspirin EC 81 MG tablet Take 1 tablet (81 mg total) by mouth daily. (Patient not taking: Reported on 10/14/2023)     No facility-administered medications prior to visit.   No Known Allergies Objective:   Today's Vitals   10/14/23 1044  BP: 126/80  Pulse: 92  Temp: 98.2 F (36.8 C)  TempSrc: Temporal  SpO2: 96%  Weight: 225 lb 12.8 oz (102.4 kg)  Height: 5' (1.524 m)   Body mass index is 44.1 kg/m.   General: Well developed, well nourished. No acute distress. Feet- Skin intact. No sign of maceration between toes. Right great toe nail has yellow discoloration at   the medial base and a split in the mid nail on that side. Dorsalis pedis  and posterior tibial artery pulses   are normal. 5.07 monofilament testing normal. Psych: Alert and oriented. Normal mood and affect.  Health Maintenance Due  Topic Date Due   Pneumococcal Vaccine 2-7 Years old (1 of 2 - PCV) Never done   OPHTHALMOLOGY EXAM  Never done   Cervical Cancer Screening (HPV/Pap Cotest)  Never done   Zoster Vaccines- Shingrix (1 of 2) Never done   HEMOGLOBIN A1C  09/02/2023   Diabetic kidney evaluation - eGFR measurement  09/21/2023   Diabetic kidney evaluation - Urine ACR  09/21/2023     Assessment & Plan:   Problem List Items Addressed This Visit       Cardiovascular and Mediastinum   Essential hypertension   Blood pressure is in good control. Continue lisinopril-HCTZ (Hyzaar) 50-12.5 mg  daily.      Relevant Medications   atorvastatin (LIPITOR) 40 MG tablet   losartan-hydrochlorothiazide (HYZAAR) 50-12.5 MG tablet     Endocrine   Type 2 diabetes mellitus with hyperglycemia, without long-term current use of insulin (HCC)   Last A1c was in excellent control. Continue metformin 500 mg bid and tirzepatide (Mounjaro) 5 mg weekly. I will check annual DM labs. I advised her to see her eye doctor for an annual retinopathy screen.       Relevant Medications   atorvastatin (LIPITOR) 40 MG tablet   losartan-hydrochlorothiazide (HYZAAR) 50-12.5 MG tablet   tirzepatide (MOUNJARO) 5 MG/0.5ML Pen   metFORMIN (GLUCOPHAGE) 500 MG tablet   Other Relevant Orders   Lipid panel   Microalbumin / creatinine urine ratio   Basic metabolic panel   Hemoglobin A1c   Urinalysis, Routine w reflex microscopic     Musculoskeletal and Integument   Onychomycosis   I will refer to podiatry.      Relevant Orders   Ambulatory referral to Podiatry     Other   Morbid obesity with BMI of 50.0-59.9, adult (HCC)   Maximum weight: 271 lbs (11/2019) Current weight: 225 lbs Weight change since last visit: - 19 lbs Total weight loss: -46 lbs (17%)  Ongoing weight loss. Continue use of tirzepatide (Mounjaro) 5 mg weekly.       Relevant Medications   tirzepatide (MOUNJARO) 5 MG/0.5ML Pen   metFORMIN (GLUCOPHAGE) 500 MG tablet   Other Visit Diagnoses       Encounter for screening mammogram for malignant neoplasm of breast    -  Primary   Relevant Orders   MM DIGITAL SCREENING BILATERAL       Return in about 2 months (around 12/12/2023) for Annual preventative care.   Loyola Mast, MD

## 2023-10-14 NOTE — Assessment & Plan Note (Signed)
I will refer to podiatry

## 2023-12-11 DIAGNOSIS — M25562 Pain in left knee: Secondary | ICD-10-CM | POA: Diagnosis not present

## 2023-12-11 DIAGNOSIS — G8929 Other chronic pain: Secondary | ICD-10-CM | POA: Diagnosis not present

## 2023-12-11 DIAGNOSIS — M1712 Unilateral primary osteoarthritis, left knee: Secondary | ICD-10-CM | POA: Diagnosis not present

## 2024-01-05 ENCOUNTER — Inpatient Hospital Stay: Admission: RE | Admit: 2024-01-05 | Payer: BC Managed Care – PPO | Source: Ambulatory Visit

## 2024-02-03 ENCOUNTER — Encounter: Payer: BC Managed Care – PPO | Admitting: Family Medicine

## 2024-09-27 ENCOUNTER — Other Ambulatory Visit: Payer: Self-pay | Admitting: Family Medicine

## 2024-09-27 DIAGNOSIS — E1165 Type 2 diabetes mellitus with hyperglycemia: Secondary | ICD-10-CM

## 2024-10-04 ENCOUNTER — Other Ambulatory Visit: Payer: Self-pay | Admitting: Family Medicine

## 2024-10-04 DIAGNOSIS — E1165 Type 2 diabetes mellitus with hyperglycemia: Secondary | ICD-10-CM

## 2024-10-04 NOTE — Telephone Encounter (Signed)
 Refill request received for Metformin  500mg  and Atorvastatin  40mg  FOV: Not scheduled LOV: 10/14/2023 Last refill: 10/14/2023 for both Medication is pending your approval.

## 2024-10-05 NOTE — Telephone Encounter (Signed)
 Patient notified VIA and scheduled for f/u 11/10/24.  Dm/cma

## 2024-11-10 ENCOUNTER — Ambulatory Visit: Admitting: Family Medicine
